# Patient Record
Sex: Male | Born: 1975 | Hispanic: Yes | Smoking: Never smoker
Health system: Southern US, Community
[De-identification: ages and names within clinical notes are randomized; demographics above are authoritative.]

---

## 2013-10-22 ENCOUNTER — Emergency Department (HOSPITAL_COMMUNITY)
Admission: EM | Admit: 2013-10-22 | Discharge: 2013-10-23 | Disposition: A | Discharge status: Home / Self Care | Payer: Self-pay | Attending: Emergency Medicine | Admitting: Emergency Medicine

## 2013-10-22 ENCOUNTER — Emergency Department (HOSPITAL_COMMUNITY): Payer: Self-pay

## 2013-10-22 ENCOUNTER — Emergency Department (HOSPITAL_COMMUNITY): Payer: Self-pay | Admitting: Anesthesiology

## 2013-10-22 ENCOUNTER — Encounter (HOSPITAL_COMMUNITY): Payer: Self-pay | Admitting: Emergency Medicine

## 2013-10-22 ENCOUNTER — Encounter (HOSPITAL_COMMUNITY): Payer: Self-pay | Admitting: Anesthesiology

## 2013-10-22 ENCOUNTER — Encounter (HOSPITAL_COMMUNITY)
Admission: EM | Disposition: A | Discharge status: Home / Self Care | Payer: Self-pay | Source: Home / Self Care | Attending: Emergency Medicine

## 2013-10-22 DIAGNOSIS — N4400 Torsion of testis, unspecified: Secondary | ICD-10-CM | POA: Insufficient documentation

## 2013-10-22 DIAGNOSIS — N501 Vascular disorders of male genital organs: Secondary | ICD-10-CM | POA: Insufficient documentation

## 2013-10-22 HISTORY — PX: SCROTAL EXPLORATION: SHX2386

## 2013-10-22 LAB — COMPREHENSIVE METABOLIC PANEL
Albumin: 4.2 g/dL (ref 3.5–5.2)
Alkaline Phosphatase: 79 U/L (ref 39–117)
BUN: 11 mg/dL (ref 6–23)
CO2: 29 mEq/L (ref 19–32)
Chloride: 99 mEq/L (ref 96–112)
Creatinine, Ser: 0.92 mg/dL (ref 0.50–1.35)
GFR calc Af Amer: >90 mL/min (ref >90)
GFR calc non Af Amer: >90 mL/min (ref >90)
Glucose, Bld: 107 mg/dL — ABNORMAL HIGH (ref 70–99)
Potassium: 3.7 mEq/L (ref 3.5–5.1)
Sodium: 136 mEq/L (ref 135–145)
Total Bilirubin: 0.3 mg/dL (ref 0.3–1.2)
Total Protein: 7.9 g/dL (ref 6.0–8.3)

## 2013-10-22 LAB — CBC WITH DIFFERENTIAL/PLATELET
Basophils Absolute: 0 10*3/uL (ref 0.0–0.1)
HCT: 40.7 % (ref 39.0–52.0)
Hemoglobin: 14.6 g/dL (ref 13.0–17.0)
Lymphocytes Relative: 21 % (ref 12–46)
Lymphs Abs: 1.8 10*3/uL (ref 0.7–4.0)
MCH: 30.9 pg (ref 26.0–34.0)
MCHC: 35.9 g/dL (ref 30.0–36.0)
Monocytes Absolute: 0.9 10*3/uL (ref 0.1–1.0)
Monocytes Relative: 10 % (ref 3–12)
Neutro Abs: 6 10*3/uL (ref 1.7–7.7)
Neutrophils Relative %: 68 % (ref 43–77)
Platelets: 259 10*3/uL (ref 150–400)
RBC: 4.72 MIL/uL (ref 4.22–5.81)
WBC: 8.8 10*3/uL (ref 4.0–10.5)

## 2013-10-22 SURGERY — EXPLORATION, SCROTUM
Anesthesia: General | Site: Scrotum | Laterality: Bilateral | Wound class: Clean Contaminated

## 2013-10-22 MED ORDER — HYDROCODONE-ACETAMINOPHEN 5-325 MG PO TABS: 1.0000 | ORAL_TABLET | ORAL | Status: DC | PRN

## 2013-10-22 MED ORDER — CEFAZOLIN SODIUM-DEXTROSE 2-3 GM-% IV SOLR
2.0000 g | Freq: Three times a day (TID) | INTRAVENOUS | Status: DC
  Administered 2013-10-22 (×2): 2 g via INTRAVENOUS
  Filled 2013-10-22: qty 50

## 2013-10-22 MED ORDER — MIDAZOLAM HCL 5 MG/5ML IJ SOLN
INTRAMUSCULAR | Status: DC | PRN
  Administered 2013-10-22: 2 mg via INTRAVENOUS

## 2013-10-22 MED ORDER — GLYCOPYRROLATE 0.2 MG/ML IJ SOLN
INTRAMUSCULAR | Status: DC | PRN
  Administered 2013-10-22: 0.4 mg via INTRAVENOUS

## 2013-10-22 MED ORDER — ONDANSETRON HCL 4 MG/2ML IJ SOLN
INTRAMUSCULAR | Status: DC | PRN
  Administered 2013-10-22: 4 mg via INTRAVENOUS

## 2013-10-22 MED ORDER — OXYCODONE HCL 5 MG PO TABS
5.0000 mg | ORAL_TABLET | Freq: Once | ORAL | Status: AC | PRN
  Administered 2013-10-23: 5 mg via ORAL

## 2013-10-22 MED ORDER — DEXAMETHASONE SODIUM PHOSPHATE 4 MG/ML IJ SOLN
INTRAMUSCULAR | Status: DC | PRN
  Administered 2013-10-22: 4 mg via INTRAVENOUS

## 2013-10-22 MED ORDER — BUPIVACAINE HCL (PF) 0.25 % IJ SOLN
INTRAMUSCULAR | Status: AC
  Filled 2013-10-22: qty 30

## 2013-10-22 MED ORDER — SUCCINYLCHOLINE CHLORIDE 20 MG/ML IJ SOLN
INTRAMUSCULAR | Status: DC | PRN
  Administered 2013-10-22: 100 mg via INTRAVENOUS

## 2013-10-22 MED ORDER — MORPHINE SULFATE 4 MG/ML IJ SOLN
4.0000 mg | Freq: Once | INTRAMUSCULAR | Status: DC
  Filled 2013-10-22: qty 1

## 2013-10-22 MED ORDER — MORPHINE SULFATE 4 MG/ML IJ SOLN
4.0000 mg | Freq: Once | INTRAMUSCULAR | Status: AC
  Administered 2013-10-22: 4 mg via INTRAMUSCULAR

## 2013-10-22 MED ORDER — OXYCODONE HCL 5 MG/5ML PO SOLN: 5.0000 mg | Freq: Once | ORAL | Status: AC | PRN

## 2013-10-22 MED ORDER — NEOSTIGMINE METHYLSULFATE 1 MG/ML IJ SOLN
INTRAMUSCULAR | Status: DC | PRN
  Administered 2013-10-22: 3.5 mg via INTRAVENOUS

## 2013-10-22 MED ORDER — HYDROMORPHONE HCL PF 1 MG/ML IJ SOLN: 0.2500 mg | INTRAMUSCULAR | Status: DC | PRN

## 2013-10-22 MED ORDER — PROPOFOL 10 MG/ML IV BOLUS
INTRAVENOUS | Status: DC | PRN
  Administered 2013-10-22: 200 mg via INTRAVENOUS

## 2013-10-22 MED ORDER — FENTANYL CITRATE 0.05 MG/ML IJ SOLN
INTRAMUSCULAR | Status: DC | PRN
  Administered 2013-10-22: 100 ug via INTRAVENOUS
  Administered 2013-10-22: 150 ug via INTRAVENOUS

## 2013-10-22 MED ORDER — LIDOCAINE HCL (CARDIAC) 20 MG/ML IV SOLN
INTRAVENOUS | Status: DC | PRN
  Administered 2013-10-22: 100 mg via INTRAVENOUS

## 2013-10-22 MED ORDER — LACTATED RINGERS IV SOLN
INTRAVENOUS | Status: DC | PRN
  Administered 2013-10-22: 22:00:00 via INTRAVENOUS

## 2013-10-22 MED ORDER — METOCLOPRAMIDE HCL 5 MG/ML IJ SOLN: 10.0000 mg | Freq: Once | INTRAMUSCULAR | Status: AC | PRN

## 2013-10-22 MED ORDER — BUPIVACAINE HCL (PF) 0.25 % IJ SOLN
INTRAMUSCULAR | Status: DC | PRN
  Administered 2013-10-22: 10 mL

## 2013-10-22 SURGICAL SUPPLY — 33 items
BAG URO CATCHER STRL LF (DRAPE) IMPLANT
BANDAGE GAUZE ELAST BULKY 4 IN (GAUZE/BANDAGES/DRESSINGS) ×2 IMPLANT
BLADE SURG 15 STRL LF DISP TIS (BLADE) IMPLANT
BLADE SURG 15 STRL SS (BLADE)
CANISTER SUCTION 2500CC (MISCELLANEOUS) ×2 IMPLANT
CLOTH BEACON ORANGE TIMEOUT ST (SAFETY) ×2 IMPLANT
CONT SPEC 4OZ CLIKSEAL STRL BL (MISCELLANEOUS) ×4 IMPLANT
DRAPE LAPAROTOMY T 102X78X121 (DRAPES) ×2 IMPLANT
DRSG PAD ABDOMINAL 8X10 ST (GAUZE/BANDAGES/DRESSINGS) ×4 IMPLANT
ELECT REM PT RETURN 9FT ADLT (ELECTROSURGICAL) ×2
ELECTRODE REM PT RTRN 9FT ADLT (ELECTROSURGICAL) ×1 IMPLANT
GLOVE BIOGEL PI IND STRL 6.5 (GLOVE) ×1 IMPLANT
GLOVE BIOGEL PI INDICATOR 6.5 (GLOVE) ×1
GLOVE SKINSENSE STRL SZ6.0 (GLOVE) ×2 IMPLANT
GLOVE SURG ORTHO 8.0 STRL STRW (GLOVE) ×2 IMPLANT
KIT BASIN OR (CUSTOM PROCEDURE TRAY) ×2 IMPLANT
KIT ROOM TURNOVER OR (KITS) ×2 IMPLANT
NS IRRIG 1000ML POUR BTL (IV SOLUTION) ×2 IMPLANT
PACK GENERAL/GYN (CUSTOM PROCEDURE TRAY) ×2 IMPLANT
PAD ARMBOARD 7.5X6 YLW CONV (MISCELLANEOUS) ×4 IMPLANT
SOLUTION BETADINE 4OZ (MISCELLANEOUS) ×2 IMPLANT
SPONGE GAUZE 4X4 12PLY (GAUZE/BANDAGES/DRESSINGS) ×2 IMPLANT
SPONGE LAP 18X18 X RAY DECT (DISPOSABLE) IMPLANT
SUT CHROMIC 3 0 SH 27 (SUTURE) ×2 IMPLANT
SUT PROLENE 4 0 PS 2 18 (SUTURE) ×2 IMPLANT
SUT SILK 2 0 TIES 10X30 (SUTURE) ×2 IMPLANT
SUT SILK 3 0 (SUTURE) ×1
SUT SILK 3-0 18XBRD TIE 12 (SUTURE) ×1 IMPLANT
SUT VICRYL 4-0 PS2 18IN ABS (SUTURE) ×2 IMPLANT
SWAB COLLECTION DEVICE MRSA (MISCELLANEOUS) IMPLANT
TOWEL OR 17X24 6PK STRL BLUE (TOWEL DISPOSABLE) ×2 IMPLANT
TOWEL OR 17X26 10 PK STRL BLUE (TOWEL DISPOSABLE) ×2 IMPLANT
WATER STERILE IRR 1000ML POUR (IV SOLUTION) ×2 IMPLANT

## 2013-10-22 NOTE — Anesthesia Procedure Notes (Addendum)
Procedure Name: Intubation Date/Time: 10/22/2013 10:26 PM Performed by: Jennilee Demarco S Pre-anesthesia Checklist: Patient identified, Timeout performed, Emergency Drugs available, Suction available and Patient being monitored Patient Re-evaluated:Patient Re-evaluated prior to inductionOxygen Delivery Method: Circle system utilized Preoxygenation: Pre-oxygenation with 100% oxygen Intubation Type: IV induction, Rapid sequence and Cricoid Pressure applied Ventilation: Mask ventilation without difficulty Laryngoscope Size: Mac and 3 Grade View: Grade I Tube type: Oral Tube size: 7.5 mm Number of attempts: 1 Airway Equipment and Method: Stylet Placement Confirmation: ETT inserted through vocal cords under direct vision,  positive ETCO2 and breath sounds checked- equal and bilateral Secured at: 21 cm Tube secured with: Tape Dental Injury: Teeth and Oropharynx as per pre-operative assessment

## 2013-10-22 NOTE — ED Provider Notes (Signed)
Medical screening examination/treatment/procedure(s) were conducted as a shared visit with non-physician practitioner(s) and myself.  I personally evaluated the patient during the encounter.  36 hour history of L testicle pain. Denies trauma.   L testicle is high riding and tender and edematous.   Ongoing pain for 36 hours, testicle likely infarcted. Manual detorsion not attempted. D/w Dr. Retta Diones.   EKG Interpretation     Ventricular Rate:    PR Interval:    QRS Duration:   QT Interval:    QTC Calculation:   R Axis:     Text Interpretation:                 Glynn Octave, MD 10/22/13 2342

## 2013-10-22 NOTE — Op Note (Signed)
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 Zareth Rippetoe, M.D.   Anesthesia: Gen.   Complications:None  Specimen(s):Left testicle  Drain(s):None  Indications:37 year old male who presented about 7 hours ago to the emergency room with a daylong history of left scrotal pain. The patient was found on ultrasound To have no flow to the left testicle with a probable torsion. Exam confirmed this. The patient presents at this time for left scrotal expiration, possible orchiectomy and right orchidopexy. The risks and complications of the procedure were discussed through an interpreter with the patient. He understands these and desires to proceed.   Technique and findings:The patient was properly identified and marked in the holding area. He received 2 g of IV Ancef. He was taken to the operating room where general anesthetic was administered with the LMA. He was placed in the recumbent position. Genitalia perineum and lower abdomen were prepped and draped. Proper timeout was then performed.  I then made a 2-1/2 cm incision in his anterior scrotum in the median raphae. This was carried down to the left tunica albuginea with electrocautery and sharp dissection. There was a hematocele present. The testicle was delivered through the wound. It was obviously infarcted. I did not think at this point that I needed to observe the testicle following detorsion. Because of the loop day long history of pain and infarction, I then felt it would be necessary to perform an orchiectomy. I dissected up the cord, and clamped the cord into different package. Distal to the clamps, the cord was divided. I then ligated each section of cord with 2 separate 2-0 silk sutures. I then blocked the cord with 10 cc of quarter percent plain Marcaine. There was adequate  hemostasis following observation of the ligated cord. There was no bleeding in the left hemiscrotum. I then turned my attention to the right hemiscrotum. The dartos fascia was divided, down to the tunica vaginalis which was then opened. I delivered the testicle the tunica vaginalis. It was inspected and found to be normal. I then sutured the tunica albuginea to the inner part of the dartos fascia in 3 separate areas, one inferiorly, one to the right and one to the left. 4-0 Prolene was used for this. Adequate hemostasis was present at this point. I then closed the dartos fascia with a running 3-0 chromic. I then closed the skin with a running 4-0 Vicryl placed in a simple running fashion. I then placed a fluff dressing and mesh underpants. The patient was then awakened and taken to the PACU in stable condition. Tolerated the procedure well.  He will followup in 2-3 weeks in my office. He was sent home with a prescription for Norco. He was given instructions.

## 2013-10-22 NOTE — Anesthesia Preprocedure Evaluation (Signed)
Anesthesia Evaluation  Patient identified by MRN, date of birth, ID band Patient awake    Reviewed: Allergy & Precautions, H&P , NPO status , Patient's Chart, lab work & pertinent test results, reviewed documented beta blocker date and time   Airway Mallampati: II TM Distance: >3 FB Neck ROM: full    Dental   Pulmonary neg pulmonary ROS,  breath sounds clear to auscultation        Cardiovascular negative cardio ROS  Rhythm:regular     Neuro/Psych negative neurological ROS  negative psych ROS   GI/Hepatic negative GI ROS, Neg liver ROS,   Endo/Other  negative endocrine ROS  Renal/GU negative Renal ROS  negative genitourinary   Musculoskeletal   Abdominal   Peds  Hematology negative hematology ROS (+)   Anesthesia Other Findings See surgeon's H&P   Reproductive/Obstetrics negative OB ROS                           Anesthesia Physical Anesthesia Plan  ASA: II  Anesthesia Plan: General   Post-op Pain Management:    Induction: Intravenous, Rapid sequence and Cricoid pressure planned  Airway Management Planned: Oral ETT  Additional Equipment:   Intra-op Plan:   Post-operative Plan: Extubation in OR  Informed Consent: I have reviewed the patients History and Physical, chart, labs and discussed the procedure including the risks, benefits and alternatives for the proposed anesthesia with the patient or authorized representative who has indicated his/her understanding and acceptance.   Dental Advisory Given  Plan Discussed with: CRNA and Surgeon  Anesthesia Plan Comments:         Anesthesia Quick Evaluation  

## 2013-10-22 NOTE — ED Notes (Signed)
Surgeon, MD at bedside.

## 2013-10-22 NOTE — Transfer of Care (Signed)
Immediate Anesthesia Transfer of Care Note  Patient: Spencer Peterson  Procedure(s) Performed: Procedure(s): SCROTUM EXPLORATION, Left Orchiectomy, Right Orchiopexy (Bilateral)  Patient Location: PACU  Anesthesia Type:General  Level of Consciousness: awake, alert  and oriented  Airway & Oxygen Therapy: Patient Spontanous Breathing and Patient connected to nasal cannula oxygen  Post-op Assessment: Report given to PACU RN and Post -op Vital signs reviewed and stable  Post vital signs: Reviewed and stable  Complications: No apparent anesthesia complications

## 2013-10-22 NOTE — ED Provider Notes (Signed)
CSN: [id number]     Arrival date & time 10/22/13  1808 History   First MD Initiated Contact with Patient 10/22/13 1857     Chief Complaint  Patient presents with  . painful genitalia    (Consider location/radiation/quality/duration/timing/severity/associated sxs/prior Treatment) HPI Comments: Patient presents today with a chief complaint of pain and swelling of his left scrotum.  Pain and swelling has been present since yesterday and is gradually worsening.  He reports that he has never had anything like this before.  He denies penile pain, penile discharge, or swelling of the penis.  He reports that he is sexually active with his wife.  He denies abdominal pain.  Denies nausea or vomiting.  Denies urinary symptoms.    The history is provided by the patient. The history is limited by a language barrier. A language interpreter was used (phone interpretor used.).    History reviewed. No pertinent past medical history. History reviewed. No pertinent past surgical history. No family history on file. History  Substance Use Topics  . Smoking status: Never Smoker   . Smokeless tobacco: Not on file  . Alcohol Use: No    Review of Systems  Genitourinary: Positive for scrotal swelling and testicular pain.  All other systems reviewed and are negative.    Allergies  Review of patient's allergies indicates no known allergies.  Home Medications  No current outpatient prescriptions on file. BP 135/89  Pulse 78  Temp(Src) 98.7 F (37.1 C)  Resp 20  Wt 158 lb (71.668 kg)  SpO2 98% Physical Exam  Nursing note and vitals reviewed. Constitutional: He appears well-developed and well-nourished.  HENT:  Head: Normocephalic and atraumatic.  Mouth/Throat: Oropharynx is clear and moist.  Neck: Normal range of motion. Neck supple.  Cardiovascular: Normal rate, regular rhythm and normal heart sounds.   Pulmonary/Chest: Effort normal and breath sounds normal.  Abdominal: Soft. Bowel sounds are  normal. He exhibits no distension and no mass. There is no tenderness. There is no rebound and no guarding.  Genitourinary: Right testis shows no swelling and no tenderness. Left testis shows swelling and tenderness. No penile erythema or penile tenderness. No discharge found.  Chaperone present High riding left testicle Swelling and tenderness of the left scrotum   Neurological: He is alert.  Skin: Skin is warm and dry.  Psychiatric: He has a normal mood and affect.    ED Course  Procedures (including critical care time) Labs Review Labs Reviewed  COMPREHENSIVE METABOLIC PANEL - Abnormal; Notable for the following:    Glucose, Bld 107 (*)    All other components within normal limits  GC/CHLAMYDIA PROBE AMP  CBC WITH DIFFERENTIAL  URINALYSIS, ROUTINE W REFLEX MICROSCOPIC   Imaging Review US Scrotum  10/22/2013   CLINICAL DATA:  35-year- male with testicular pain. Initial encounter.  EXAM: SCROTAL ULTRASOUND  DOPPLER ULTRASOUND OF THE TESTICLES  TECHNIQUE: Complete ultrasound examination of the testicles, epididymis, and other scrotal structures was performed. Color and spectral Doppler ultrasound were also utilized to evaluate blood flow to the testicles.  COMPARISON:  None.  FINDINGS: Right testicle  Measurements: Normal echotexture. 4.8 x 2.6 x 3.2 cm. No mass or microlithiasis visualized.  Left testicle  Measurements: Abnormal heterogeneous echotexture. Asymmetrically enlarged, 4.0 x 3.5 x 3.7 cm. No discrete mass identified.  Right epididymis:  Normal in size and appearance.  Left epididymis:  Appears asymmetrically enlarged and hypoechogenic.  Hydrocele:  None visualized.  Varicocele:  None visualized.  Pulsed Doppler interrogation of both testes  demonstrates absent flow in the left testicle and epididymis.  There is preserved low-density waveforms and power Doppler in the right testicle and epididymis.  IMPRESSION: 1. Positive for left testicular torsion. Edematous left testicle and  epididymis.  Critical Value/emergent results were called by telephone at the time of interpretation on 10/22/2013 at 8:45 PM to PA [facility] , who verbally acknowledged these results.  2.  Normal right testicle and epididymis.   Electronically Signed   By: Augusto Gamble M.D.   On: 10/22/2013 20:45   [place] Art/ven Flow Abd Pelv Doppler  10/22/2013   CLINICAL DATA:  35-year- male with testicular pain. Initial encounter.  EXAM: SCROTAL ULTRASOUND  DOPPLER ULTRASOUND OF THE TESTICLES  TECHNIQUE: Complete ultrasound examination of the testicles, epididymis, and other scrotal structures was performed. Color and spectral Doppler ultrasound were also utilized to evaluate blood flow to the testicles.  COMPARISON:  None.  FINDINGS: Right testicle  Measurements: Normal echotexture. 4.8 x 2.6 x 3.2 cm. No mass or microlithiasis visualized.  Left testicle  Measurements: Abnormal heterogeneous echotexture. Asymmetrically enlarged, 4.0 x 3.5 x 3.7 cm. No discrete mass identified.  Right epididymis:  Normal in size and appearance.  Left epididymis:  Appears asymmetrically enlarged and hypoechogenic.  Hydrocele:  None visualized.  Varicocele:  None visualized.  Pulsed Doppler interrogation of both testes demonstrates absent flow in the left testicle and epididymis.  There is preserved low-density waveforms and power Doppler in the right testicle and epididymis.  IMPRESSION: 1. Positive for left testicular torsion. Edematous left testicle and epididymis.  Critical Value/emergent results were called by telephone at the time of interpretation on 10/22/2013 at 8:45 PM to PA [facility] , who verbally acknowledged these results.  2.  Normal right testicle and epididymis.   Electronically Signed   By: Augusto Gamble M.D.   On: 10/22/2013 20:45    EKG Interpretation   None      8:58 PM Discussed with Dr. Retta Diones with Urology.  He reports that he will come see the patient in the ED.  MDM  No diagnosis found. Patient presents  today with a chief complaint of left testicular pain and swelling that has been present since yesterday.  Scrotal ultrasound showing left testicular torsion.  Urology consulted.  Urology has agreed to see the patient in the ED and admit for surgical management.      Santiago Glad, PA-C 10/22/13 2329

## 2013-10-22 NOTE — ED Notes (Signed)
The pt has had pain and swelling in his penis since yesterday

## 2013-10-22 NOTE — Anesthesia Postprocedure Evaluation (Signed)
Anesthesia Post Note  Patient: Spencer Peterson  Procedure(s) Performed: Procedure(s) (LRB): SCROTUM EXPLORATION, Left Orchiectomy, Right Orchiopexy (Bilateral)  Anesthesia type: General  Patient location: PACU  Post pain: Pain level controlled  Post assessment: Patient's Cardiovascular Status Stable  Last Vitals:  Filed Vitals:   10/22/13 2315  BP: 132/96  Pulse: 66  Temp:   Resp: 17    Post vital signs: Reviewed and stable  Level of consciousness: alert  Complications: No apparent anesthesia complications

## 2013-10-22 NOTE — ED Provider Notes (Signed)
Spencer Peterson S 8:30 PM patient discussed in sign out. Pt waiting to be seen by urology specialist for testicular torsion.   Dr. Retta Diones has seen the patient and he will plan to take to the OR for surgery.  Angus Seller, PA-C 10/22/13 2207

## 2013-10-22 NOTE — H&P (Signed)
Urology History and Physical Exam  CC: Testicular torsion  HPI: 37 year old Hispanic male presented to the emergency room here at [facility] several hours ago with a one-day history of left testicular pain. This was fairly severe. The patient was sent to ultrasound with the provisional diagnosis of testicular torsion. This was verified by scrotal ultrasound and Doppler. There was no flow on the Doppler study. Despite the fact that the patient has had this pain for over 24 hours, he presents at this time to go to the operating room for an attempted detorsion and probable orchiectomy.  The patient is a Education administrator. He is married and has a 68-month-old son.  PMH: History reviewed. No pertinent past medical history.  PSH: History reviewed. No pertinent past surgical history.  Allergies: No Known Allergies  Medications:  (Not in a hospital admission)   Social History: History   Social History  . Marital Status: Married    Spouse Name: N/A    Number of Children: N/A  . Years of Education: N/A   Occupational History  . Not on file.   Social History Main Topics  . Smoking status: Never Smoker   . Smokeless tobacco: Not on file  . Alcohol Use: No  . Drug Use: Not on file  . Sexual Activity: Not on file   Other Topics Concern  . Not on file   Social History Narrative  . No narrative on file    Family History: No family history on file.  Review of Systems: Positive: Left testicular pain, scrotal swelling  Negative: .  A further 10 point review of systems was negative except what is listed in the HPI.  Physical Exam: @VITALS2 @ General: No acute distress.  Awake. Head:  Normocephalic.  Atraumatic. ENT:  EOMI.  Mucous membranes moist Neck:  Supple.  No lymphadenopathy. CV:  S1 present. S2 present. Regular rate. Pulmonary: Equal effort bilaterally.  Clear to auscultation bilaterally. Abdomen: Soft.  Non tender to palpation. Skin:  Normal turgor.  No visible  rash. Extremity: No gross deformity of bilateral upper extremities.  No gross deformity of    bilateral lower extremities. Neurologic: Alert. Appropriate mood.  Penis:  Uncircumcised.  No lesions. Scrotum: No lesions.  No ecchymosis.  No erythema. Testicles: Testicles are descended bilaterally. The left testicle is high lying in the left hemiscrotum. There is tenderness. There is a positive cremaster reflex on the right, none on the left.   Studies:  Recent Labs     10/22/13  1823  HGB  14.6  WBC  8.8  PLT  259    Recent Labs     10/22/13  1823  NA  136  K  3.7  CL  99  CO2  29  BUN  11  CREATININE  0.92  CALCIUM  9.5  GFRNONAA  >90  GFRAA  >90     No results found for this basename: PT, INR, APTT,  in the last 72 hours   No components found with this basename: ABG,     Assessment:  Left testicular torsion with probable infarct  Plan: Scrotal expiration, detorsion of left testicle, probable left orchiectomy, right orchidopexy.  I've discussed the procedure with the patient, risks and complications. We also discussed briefly long-term implications-were likely, this will not affect his fertility. He understands the procedure and desires to proceed.Marland Kitchen

## 2013-10-22 NOTE — Preoperative (Signed)
Beta Blockers   Reason not to administer Beta Blockers:Not Applicable 

## 2013-10-23 LAB — GC/CHLAMYDIA PROBE AMP
CT Probe RNA: NEGATIVE
GC Probe RNA: NEGATIVE

## 2013-10-23 MED ORDER — OXYCODONE HCL 5 MG PO TABS
ORAL_TABLET | ORAL | Status: AC
  Filled 2013-10-23: qty 1

## 2013-10-23 NOTE — Progress Notes (Signed)
Pt and family speak very little english.  Used interpreter phones to discuss with sister important discharge instructions, and went over procedure with her.  Sister then discussed the information with her brother (the patient) and his wife.  All seem to understand pretty well.  Some of the written discharge information was presented in spanish.

## 2013-10-24 ENCOUNTER — Encounter (HOSPITAL_COMMUNITY): Payer: Self-pay | Admitting: Urology

## 2013-10-28 NOTE — ED Provider Notes (Signed)
Medical screening examination/treatment/procedure(s) were performed by non-physician practitioner and as supervising physician I was immediately available for consultation/collaboration.  EKG Interpretation     Ventricular Rate:    PR Interval:    QRS Duration:   QT Interval:    QTC Calculation:   R Axis:     Text Interpretation:                Roney Marion, MD 10/28/13 [phone]

## 2019-03-17 ENCOUNTER — Encounter (HOSPITAL_COMMUNITY): Payer: Self-pay | Admitting: Emergency Medicine

## 2019-03-17 ENCOUNTER — Emergency Department (HOSPITAL_COMMUNITY)
Admission: EM | Admit: 2019-03-17 | Discharge: 2019-03-17 | Disposition: A | Discharge status: Home / Self Care | Payer: Self-pay | Attending: Emergency Medicine | Admitting: Emergency Medicine

## 2019-03-17 DIAGNOSIS — Z79899 Other long term (current) drug therapy: Secondary | ICD-10-CM | POA: Insufficient documentation

## 2019-03-17 DIAGNOSIS — R1013 Epigastric pain: Secondary | ICD-10-CM | POA: Insufficient documentation

## 2019-03-17 DIAGNOSIS — K625 Hemorrhage of anus and rectum: Secondary | ICD-10-CM | POA: Insufficient documentation

## 2019-03-17 DIAGNOSIS — K921 Melena: Secondary | ICD-10-CM

## 2019-03-17 LAB — CBC WITH DIFFERENTIAL/PLATELET
ABS IMMATURE GRANULOCYTES: 0.06 10*3/uL (ref 0.00–0.07)
Basophils Absolute: 0.1 10*3/uL (ref 0.0–0.1)
Basophils Relative: 0 %
Eosinophils Absolute: 0.4 10*3/uL (ref 0.0–0.5)
Eosinophils Relative: 2 %
HEMATOCRIT: 39.2 % (ref 39.0–52.0)
Hemoglobin: 13.3 g/dL (ref 13.0–17.0)
Immature Granulocytes: 0 %
Lymphocytes Relative: 9 %
Lymphs Abs: 1.4 10*3/uL (ref 0.7–4.0)
MCH: 30.6 pg (ref 26.0–34.0)
MCHC: 33.9 g/dL (ref 30.0–36.0)
MCV: 90.3 fL (ref 80.0–100.0)
MONO ABS: 1.7 10*3/uL — AB (ref 0.1–1.0)
MONOS PCT: 11 %
NEUTROS PCT: 78 %
Neutro Abs: 11.2 10*3/uL — ABNORMAL HIGH (ref 1.7–7.7)
Platelets: 633 10*3/uL — ABNORMAL HIGH (ref 150–400)
RBC: 4.34 MIL/uL (ref 4.22–5.81)
RDW: 12.8 % (ref 11.5–15.5)
WBC: 14.7 10*3/uL — ABNORMAL HIGH (ref 4.0–10.5)
nRBC: 0 % (ref 0.0–0.2)

## 2019-03-17 LAB — COMPREHENSIVE METABOLIC PANEL
ALK PHOS: 80 U/L (ref 38–126)
ALT: 18 U/L (ref 0–44)
AST: 21 U/L (ref 15–41)
Albumin: 2.7 g/dL — ABNORMAL LOW (ref 3.5–5.0)
Anion gap: 7 (ref 5–15)
BILIRUBIN TOTAL: 0.4 mg/dL (ref 0.3–1.2)
BUN: 6 mg/dL (ref 6–20)
CALCIUM: 8.5 mg/dL — AB (ref 8.9–10.3)
CHLORIDE: 103 mmol/L (ref 98–111)
CO2: 25 mmol/L (ref 22–32)
CREATININE: 0.95 mg/dL (ref 0.61–1.24)
GFR calc Af Amer: >60 mL/min (ref >60)
Glucose, Bld: 102 mg/dL — ABNORMAL HIGH (ref 70–99)
Potassium: 3.6 mmol/L (ref 3.5–5.1)
Sodium: 135 mmol/L (ref 135–145)
TOTAL PROTEIN: 6.8 g/dL (ref 6.5–8.1)

## 2019-03-17 LAB — POC OCCULT BLOOD, ED: Fecal Occult Bld: POSITIVE — AB

## 2019-03-17 LAB — LIPASE, BLOOD: LIPASE: 42 U/L (ref 11–51)

## 2019-03-17 MED ORDER — SODIUM CHLORIDE 0.9 % IV BOLUS
1000.0000 mL | Freq: Once | INTRAVENOUS | Status: AC
  Administered 2019-03-17: 1000 mL via INTRAVENOUS

## 2019-03-17 NOTE — ED Provider Notes (Signed)
[facility] Provider Note   CSN: [id number] Arrival date & time: 03/17/19  1318    History   Chief Complaint No chief complaint on file.   HPI [facility] is a 43 y.o. male.     43 yo M with a cc of abdominal pain, rectal bleeding.  Going on for the past two weeks.  Usually occurs after a bowel movement.  Has some mild persistent. Pain.  Bright red blood on the paper and in the bowel. No pain or itching at the rectum.   The history is provided by the patient.  Illness  Severity:  Mild Onset quality:  Sudden Timing:  Constant Progression:  Worsening Chronicity:  New Associated symptoms: abdominal pain   Associated symptoms: no chest pain, no congestion, no diarrhea, no fever, no headaches, no myalgias, no rash, no shortness of breath and no vomiting     History reviewed. No pertinent past medical history.  There are no active problems to display for this patient.   Past Surgical History:  Procedure Laterality Date  . SCROTAL EXPLORATION Bilateral 10/22/2013   Procedure: SCROTUM EXPLORATION, Left Orchiectomy, Right Orchiopexy;  Surgeon: Marcine Matar, MD;  Location: [facility] OR;  Service: Urology;  Laterality: Bilateral;        Home Medications    Prior to Admission medications   Medication Sig Start Date End Date Taking? Authorizing Provider  acetaminophen (TYLENOL) 500 MG tablet Take 1,000 mg by mouth 2 (two) times daily as needed for moderate pain.    [provider]  HYDROcodone-acetaminophen (NORCO) 5-325 MG per tablet Take 1-2 tablets by mouth every 4 (four) hours as needed for moderate pain. 10/22/13   Marcine Matar, MD    Family History History reviewed. No pertinent family history.  Social History Social History   Tobacco Use  . Smoking status: Never Smoker  . Smokeless tobacco: Never Used  Substance Use Topics  . Alcohol use: Yes    Comment: occ  . Drug use: Never     Allergies   Patient has  no known allergies.   Review of Systems Review of Systems  Constitutional: Negative for chills and fever.  HENT: Negative for congestion and facial swelling.   Eyes: Negative for discharge and visual disturbance.  Respiratory: Negative for shortness of breath.   Cardiovascular: Negative for chest pain and palpitations.  Gastrointestinal: Positive for abdominal pain and blood in stool. Negative for diarrhea and vomiting.  Musculoskeletal: Negative for arthralgias and myalgias.  Skin: Negative for color change and rash.  Neurological: Negative for tremors, syncope and headaches.  Psychiatric/Behavioral: Negative for confusion and dysphoric mood.     Physical Exam Updated Vital Signs BP 117/78 (BP Location: Left Arm)   Pulse 93   Temp 97.9 F (36.6 C) (Oral)   Resp 18   SpO2 100%   Physical Exam Vitals signs and nursing note reviewed.  Constitutional:      Appearance: He is well-developed.  HENT:     Head: Normocephalic and atraumatic.  Eyes:     Pupils: Pupils are equal, round, and reactive to light.  Neck:     Musculoskeletal: Normal range of motion and neck supple.     Vascular: No JVD.  Cardiovascular:     Rate and Rhythm: Regular rhythm. Tachycardia present.     Heart sounds: No murmur. No friction rub. No gallop.   Pulmonary:     Effort: No respiratory distress.     Breath sounds: No wheezing.  Abdominal:     General: There is no distension.     Tenderness: There is abdominal tenderness (very mild to the epigastrium). There is no guarding or rebound.  Genitourinary:    Comments: Small hemorrhoid at the 6 o clock position, no active bleeding.  Musculoskeletal: Normal range of motion.  Skin:    Coloration: Skin is not pale.     Findings: No rash.  Neurological:     Mental Status: He is alert and oriented to person, place, and time.  Psychiatric:        Behavior: Behavior normal.      ED Treatments / Results  Labs (all labs ordered are listed, but only  abnormal results are displayed) Labs Reviewed  CBC WITH DIFFERENTIAL/PLATELET - Abnormal; Notable for the following components:      Result Value   WBC 14.7 (*)    Platelets 633 (*)    Neutro Abs 11.2 (*)    Monocytes Absolute 1.7 (*)    All other components within normal limits  COMPREHENSIVE METABOLIC PANEL - Abnormal; Notable for the following components:   Glucose, Bld 102 (*)    Calcium 8.5 (*)    Albumin 2.7 (*)    All other components within normal limits  POC OCCULT BLOOD, ED - Abnormal; Notable for the following components:   Fecal Occult Bld POSITIVE (*)    All other components within normal limits  LIPASE, BLOOD    EKG None  Radiology No results found.  Procedures Procedures (including critical care time)  Medications Ordered in ED Medications  sodium chloride 0.9 % bolus 1,000 mL (0 mLs Intravenous Stopped 03/17/19 1542)     Initial Impression / Assessment and Plan / ED Course  I have reviewed the triage vital signs and the nursing notes.  Pertinent labs & imaging results that were available during my care of the patient were reviewed by me and considered in my medical decision making (see chart for details).        43 yo M with a cc of abdominal pain and rectal bleeding.  Benign abdominal exam, very mild tenderness to the epigastrium.  Will obtain labwork.  Rectal exam with no contents in the rectum.  Tachycardic.   Patient's heart rate is improved with IV fluids.  Lab work is remarkable for a leukocytosis and his occult card was positive.  His hemoglobin is normal.  LFTs and lipase are normal.  At this point I refer him to GI for further evaluation of 2 weeks of diarrhea that he is concerned may be bloody.  PCP follow-up.  5:01 PM:  I have discussed the diagnosis/risks/treatment options with the patient and believe the pt to be eligible for discharge home to follow-up with PCP, GI. We also discussed returning to the ED immediately if new or worsening sx  occur. We discussed the sx which are most concerning (e.g., sudden worsening pain, fever, inability to tolerate by mouth) that necessitate immediate return. Medications administered to the patient during their visit and any new prescriptions provided to the patient are listed below.  Medications given during this visit Medications  sodium chloride 0.9 % bolus 1,000 mL (0 mLs Intravenous Stopped 03/17/19 1542)     The patient appears reasonably screen and/or stabilized for discharge and I doubt any other medical condition or other [facility] requiring further screening, evaluation, or treatment in the ED at this time prior to discharge.    Final Clinical Impressions(s) / ED Diagnoses   Final diagnoses:  Epigastric abdominal pain  Blood in stool    ED Discharge Orders    None       Melene Plan, DO 03/17/19 1701

## 2019-03-17 NOTE — Discharge Instructions (Signed)
Try zantac or pepcid twice a day.  Try to avoid things that may make this worse, most commonly these are spicy foods tomato based products fatty foods chocolate and peppermint.  Alcohol and tobacco can also make this worse.  Return to the emergency department for sudden worsening pain fever or inability to eat or drink.  Your labwork looked ok, no loss of blood.  See GI in the office   Prueba zantac o pepcid dos veces al da. Trate de evitar cosas que puedan empeorar esto, ms comnmente son alimentos picantes, productos a base de tomate, alimentos grasos, chocolate y [place]. El alcohol y el tabaco tambin pueden empeorar esto. Regrese a la sala de emergencias por empeoramiento repentino, fiebre del dolor o incapacidad para comer o beber.

## 2019-03-17 NOTE — ED Triage Notes (Signed)
Pt reports abdominal pain under his belly button when he has the urge to have a bowel movement x 2 weeks, last BM was today and was loose, reports it looked bloody.

## 2019-06-23 ENCOUNTER — Encounter (HOSPITAL_COMMUNITY): Payer: Self-pay

## 2019-06-23 ENCOUNTER — Emergency Department (HOSPITAL_COMMUNITY): Payer: Self-pay

## 2019-06-23 ENCOUNTER — Emergency Department (HOSPITAL_COMMUNITY)
Admission: EM | Admit: 2019-06-23 | Discharge: 2019-06-24 | Disposition: A | Discharge status: Home / Self Care | Payer: Self-pay | Attending: Emergency Medicine | Admitting: Emergency Medicine

## 2019-06-23 ENCOUNTER — Other Ambulatory Visit: Payer: Self-pay

## 2019-06-23 DIAGNOSIS — R0602 Shortness of breath: Secondary | ICD-10-CM | POA: Insufficient documentation

## 2019-06-23 DIAGNOSIS — R000 Tachycardia, unspecified: Secondary | ICD-10-CM | POA: Insufficient documentation

## 2019-06-23 MED ORDER — SODIUM CHLORIDE 0.9% FLUSH: 3.0000 mL | Freq: Once | INTRAVENOUS | Status: DC

## 2019-06-23 MED ORDER — ALBUTEROL SULFATE (2.5 MG/3ML) 0.083% IN NEBU: 5.0000 mg | INHALATION_SOLUTION | Freq: Once | RESPIRATORY_TRACT | Status: DC

## 2019-06-23 NOTE — ED Triage Notes (Signed)
Pt arrives POV for eval of shortness of breath w/ associated CP on inspiration. Pt reports onset 30 minutes PTA. States his father has been sick at home, denies cough or known covid.

## 2019-06-24 ENCOUNTER — Emergency Department (HOSPITAL_COMMUNITY): Payer: Self-pay

## 2019-06-24 ENCOUNTER — Encounter (HOSPITAL_COMMUNITY): Payer: Self-pay | Admitting: Radiology

## 2019-06-24 LAB — BASIC METABOLIC PANEL
Anion gap: 12 (ref 5–15)
BUN: <5 mg/dL — ABNORMAL LOW (ref 6–20)
CO2: 23 mmol/L (ref 22–32)
Calcium: 8.9 mg/dL (ref 8.9–10.3)
Chloride: 100 mmol/L (ref 98–111)
Creatinine, Ser: 0.69 mg/dL (ref 0.61–1.24)
GFR calc Af Amer: >60 mL/min (ref >60)
GFR calc non Af Amer: >60 mL/min (ref >60)
Glucose, Bld: 173 mg/dL — ABNORMAL HIGH (ref 70–99)
Potassium: 3 mmol/L — ABNORMAL LOW (ref 3.5–5.1)
Sodium: 135 mmol/L (ref 135–145)

## 2019-06-24 LAB — CBC
HCT: 39.2 % (ref 39.0–52.0)
Hemoglobin: 12.7 g/dL — ABNORMAL LOW (ref 13.0–17.0)
MCH: 26.2 pg (ref 26.0–34.0)
MCHC: 32.4 g/dL (ref 30.0–36.0)
MCV: 80.8 fL (ref 80.0–100.0)
Platelets: 369 10*3/uL (ref 150–400)
RBC: 4.85 MIL/uL (ref 4.22–5.81)
RDW: 16.7 % — ABNORMAL HIGH (ref 11.5–15.5)
WBC: 8.2 10*3/uL (ref 4.0–10.5)
nRBC: 0 % (ref 0.0–0.2)

## 2019-06-24 LAB — TROPONIN I (HIGH SENSITIVITY)
Troponin I (High Sensitivity): 6 ng/L (ref <18)
Troponin I (High Sensitivity): 5 ng/L (ref <18)

## 2019-06-24 MED ORDER — IOHEXOL 350 MG/ML SOLN
80.0000 mL | Freq: Once | INTRAVENOUS | Status: AC | PRN
  Administered 2019-06-24: 80 mL via INTRAVENOUS

## 2019-06-24 MED ORDER — SODIUM CHLORIDE 0.9 % IV BOLUS
1000.0000 mL | Freq: Once | INTRAVENOUS | Status: AC
  Administered 2019-06-24: 1000 mL via INTRAVENOUS

## 2019-06-24 NOTE — Discharge Instructions (Addendum)
Gracias por permitirme cuidarlo hoy en el Departamento de Emergencias.  Su tomografa computarizada no mostr un cogulo de sangre en sus pulmones. Tu anlisis de [employer]. En este momento, no estoy seguro de por qu desarrollaste falta de aliento ms temprano esta noche.  Su TC mostr algunos ndulos en su pulmn derecho. Como no fuma, es muy probable que sean benignos. Puede realizar un seguimiento con atencin primaria para determinar si debe realizarse una exploracin repetida en 12 meses.  Haga un seguimiento con atencin primaria para volver a verificar sus sntomas en la prxima semana. Si no tiene un proveedor de [employer], llame al nmero que figura en su documentacin de alta para establecer uno.  Regrese a la sala de emergencias si presenta dificultad respiratoria severa y persistente, dolor en el pecho, fiebre alta, si se desmaya, si sus labios o dedos se ponen azules, o si desarrolla otros sntomas nuevos y preocupantes.  Thank you for allowing me to care for you today in the Emergency Department.   Your CT scan did not show a blood clot in your lungs.  Your blood work was otherwise reassuring.  At this time, I am uncertain as to why you developed shortness of breath earlier tonight.  You CT did show some nodules in your right lung. Since you do not smoke, these are most likely benign. You can follow up with primary care to determine if you should have a repeat scan in 12 months.   Please follow up with primary care for a recheck of your symptoms in the next week. If you do not have a primary care provider, call the number on your discharge paperwork to get established with one.  Return to the ER if you develop severe, persistent shortness of breath, chest pain, a high fever, if you pass out, if your lips or fingers turn blue, or if you develop other new, concerning symptoms.

## 2019-06-24 NOTE — ED Provider Notes (Signed)
[facility]   CSN: [id number] Arrival date & time: 06/23/19  2225    History   Chief Complaint Chief Complaint  Patient presents with   Shortness of Breath    HPI [facility] is a 43 y.o. male with a history of rectal bleeding and alcohol use who presents to the emergency department with a chief complaint of shortness of breath.  The patient endorses shortness of breath that began suddenly approximately 30 minutes prior to arrival.  He reports that shortness of breath was constant, but seems to have significantly improved since he arrived in the ER.  He reports that he had just finished eating dinner.  He reports the shortness of breath was worse with exertion.  No known alleviating factors.  States that his hands felt cold when the shortness of breath began.  He has had no chest pain, chest tightness, palpitations, cough, fever, chills, leg swelling, abdominal pain, nausea, vomiting, or diarrhea.  No recent long travel.  No sick contacts.  No family or personal history of VTE.  No known or suspected contacts with COVID-19.  He reports that he drinks alcohol daily.  He reports that he drank 10 beers yesterday and had 1 beer approximately 1 hour prior to onset of shortness of breath tonight.  No history of DTs, complicated withdrawal, alcohol-related seizures, or tremor from withdrawal.  He denies other IV or recreational drug use.     The history is provided by the patient. No language interpreter was used.    History reviewed. No pertinent past medical history.  There are no active problems to display for this patient.   Past Surgical History:  Procedure Laterality Date   SCROTAL EXPLORATION Bilateral 10/22/2013   Procedure: SCROTUM EXPLORATION, Left Orchiectomy, Right Orchiopexy;  Surgeon: Marcine MatarStephen Dahlstedt, MD;  [facility];  Service: Urology;  Laterality: Bilateral;        Home Medications    Prior to Admission  medications   Medication Sig Start Date End Date Taking? Authorizing Provider  acetaminophen (TYLENOL) 500 MG tablet Take 1,000 mg by mouth 2 (two) times daily as needed for moderate pain.    [provider]  HYDROcodone-acetaminophen (NORCO) 5-325 MG per tablet Take 1-2 tablets by mouth every 4 (four) hours as needed for moderate pain. 10/22/13   Marcine Matarahlstedt, Stephen, MD    Family History History reviewed. No pertinent family history.  Social History Social History   Tobacco Use   Smoking status: Never Smoker   Smokeless tobacco: Never Used  Substance Use Topics   Alcohol use: Yes    Comment: occ   Drug use: Never     Allergies   Patient has no known allergies.   Review of Systems Review of Systems  Constitutional: Negative for appetite change, chills and fever.  HENT: Negative for congestion and sore throat.   Eyes: Negative for visual disturbance.  Respiratory: Positive for shortness of breath. Negative for apnea, cough, chest tightness and wheezing.   Cardiovascular: Negative for chest pain, palpitations and leg swelling.  Gastrointestinal: Negative for abdominal pain, blood in stool, diarrhea, nausea and vomiting.  Genitourinary: Negative for dysuria.  Musculoskeletal: Negative for back pain, myalgias, neck pain and neck stiffness.  Skin: Negative for rash.  Allergic/Immunologic: Negative for immunocompromised state.  Neurological: Negative for dizziness, syncope, weakness, numbness and headaches.  Psychiatric/Behavioral: Negative for confusion.   Physical Exam Updated Vital Signs BP (!) 147/96    Pulse (!) 111  Temp 98.7 F (37.1 C) (Oral)    Resp 19    Ht 5' 4.17" (1.63 m)    Wt 72.6 kg    SpO2 98%    BMI 27.32 kg/m   Physical Exam Vitals signs and nursing note reviewed.  Constitutional:      General: He is not in acute distress.    Appearance: Normal appearance. He is well-developed. He is not ill-appearing or toxic-appearing.  HENT:     Head:  Normocephalic.     Mouth/Throat:     Mouth: Mucous membranes are moist.  Eyes:     Extraocular Movements: Extraocular movements intact.     Conjunctiva/sclera: Conjunctivae normal.     Pupils: Pupils are equal, round, and reactive to light.  Neck:     Musculoskeletal: Normal range of motion and neck supple.  Cardiovascular:     Rate and Rhythm: Regular rhythm. Tachycardia present.     Heart sounds: No murmur.  Pulmonary:     Effort: Pulmonary effort is normal. No respiratory distress.     Breath sounds: No stridor. No wheezing, rhonchi or rales.     Comments: Lungs are clear to auscultation bilaterally. Abdominal:     General: There is no distension.     Palpations: Abdomen is soft. There is no mass.     Tenderness: There is no abdominal tenderness. There is no right CVA tenderness, left CVA tenderness, guarding or rebound.     Hernia: No hernia is present.  Skin:    General: Skin is warm and dry.  Neurological:     Mental Status: He is alert.  Psychiatric:        Behavior: Behavior normal.      ED Treatments / Results  Labs (all labs ordered are listed, but only abnormal results are displayed) Labs Reviewed  BASIC METABOLIC PANEL - Abnormal; Notable for the following components:      Result Value   Potassium 3.0 (*)    Glucose, Bld 173 (*)    BUN <5 (*)    All other components within normal limits  CBC - Abnormal; Notable for the following components:   Hemoglobin 12.7 (*)    RDW 16.7 (*)    All other components within normal limits  TROPONIN I (HIGH SENSITIVITY)  TROPONIN I (HIGH SENSITIVITY)    EKG None  Radiology Dg Chest 2 View  Result Date: 06/23/2019 CLINICAL DATA:  Shortness of breath with chest pain EXAM: CHEST - 2 VIEW COMPARISON:  None. FINDINGS: The heart size and mediastinal contours are within normal limits. Both lungs are clear. The visualized skeletal structures are unremarkable. IMPRESSION: No active cardiopulmonary disease. Electronically Signed    By: Alcide CleverMark  Lukens M.D.   On: 06/23/2019 23:45   Ct Angio Chest Pe W And/or Wo Contrast  Result Date: 06/24/2019 CLINICAL DATA:  Shortness of breath. EXAM: CT ANGIOGRAPHY CHEST WITH CONTRAST TECHNIQUE: Multidetector CT imaging of the chest was performed using the standard protocol during bolus administration of intravenous contrast. Multiplanar CT image reconstructions and MIPs were obtained to evaluate the vascular anatomy. CONTRAST:  80mL OMNIPAQUE IOHEXOL 350 MG/ML SOLN COMPARISON:  Chest x-ray dated 06/23/2019 FINDINGS: Cardiovascular: Satisfactory opacification of the pulmonary arteries to the segmental level. No evidence of pulmonary embolism. Normal heart size. No pericardial effusion. Mediastinum/Nodes: No enlarged mediastinal, hilar, or axillary lymph nodes. Thyroid gland, trachea, and esophagus demonstrate no significant findings. Lungs/Pleura: There is several tiny vague nodular densities in the right lower lobe and right middle lobe,  the largest being 4 mm. The lungs are otherwise clear. No effusions. Upper Abdomen: Normal. Musculoskeletal: Normal. Review of the MIP images confirms the above findings. IMPRESSION: 1. No evidence of pulmonary emboli. 2. Several tiny nodular densities in the right lung are most likely benign and inflammatory in origin. No follow-up needed if patient is low-risk (and has no known or suspected primary neoplasm). Non-contrast chest CT can be considered in 12 months if patient is high-risk. This recommendation follows the consensus statement: Guidelines for Management of Incidental Pulmonary Nodules Detected on CT Images: From the Fleischner Society 2017; Radiology 2017; 284:228-243. Electronically Signed   By: Lorriane Shire M.D.   On: 06/24/2019 04:45    Procedures Procedures (including critical care time)  Medications Ordered in ED Medications  sodium chloride flush (NS) 0.9 % injection 3 mL (3 mLs Intravenous Not Given 06/24/19 0237)  iohexol (OMNIPAQUE) 350 MG/ML  injection 80 mL (80 mLs Intravenous Contrast Given 06/24/19 0404)  sodium chloride 0.9 % bolus 1,000 mL (0 mLs Intravenous Stopped 06/24/19 0649)     Initial Impression / Assessment and Plan / ED Course  I have reviewed the triage vital signs and the nursing notes.  Pertinent labs & imaging results that were available during my care of the patient were reviewed by me and considered in my medical decision making (see chart for details).        43 year old male with a history of rectal bleeding and alcohol use presenting with shortness of breath that began tonight.  States his hands felt "cold" when symptoms because, but reports this has since resolved. No other associated symptoms.  Patient is tachycardic and mildly hypertensive, but otherwise hemodynamically stable without tachypnea or hypoxia.  EKG with sinus tachycardia.  Delta troponin ordered by triage, delta was not uptrending. He has having no bleeding.  Labs are otherwise grossly unremarkable.  Patient is not PERC negative given shortness of breath and the setting of tachycardia, will order PE study.  PE study with nodules in the right lung.  These findings were discussed with the patient, but he is low risk since he is a non-smoker.  Patient was given 1 L of fluids to see if tachycardia improved.  Recheck temperature is 97.8.  Tachycardia could be second to alcohol withdrawal as he does have a longstanding history of alcohol use.  He did have one beer tonight.  CIWA score was 1.   Following IV fluids, tachycardia not resolved.  He previously had a positive Hemoccult, but denies bleeding at this time and hemoglobin is stable from previous.  He remained asymptomatic.  Of note, it does appear that tachycardia does resolve and the patient is resting and relaxing.  Could be secondary to anxiety his heart rate does seem to increase when staff enters the room.  Shared decision making conversation with the patient for COVID-19 testing as he was  endorsing shortness of breath.  He declines testing at this time.  He has not established with a primary care provider, but referrals have been given.  Patient reports that he is feeling much better.  He is not hypoxic.  All questions answered.  He is hemodynamically stable and safe for discharge home at this time.    Final Clinical Impressions(s) / ED Diagnoses   Final diagnoses:  Shortness of breath  Tachycardia    ED Discharge Orders    None       Joanne Gavel, PA-C 06/24/19 0754    Horton, Barbette Hair, MD  06/28/19 0953 ° °

## 2019-12-26 ENCOUNTER — Emergency Department (HOSPITAL_COMMUNITY)
Admission: EM | Admit: 2019-12-26 | Discharge: 2019-12-27 | Disposition: A | Discharge status: Home / Self Care | Payer: Self-pay | Attending: Emergency Medicine | Admitting: Emergency Medicine

## 2019-12-26 ENCOUNTER — Other Ambulatory Visit: Payer: Self-pay

## 2019-12-26 ENCOUNTER — Encounter (HOSPITAL_COMMUNITY): Payer: Self-pay | Admitting: *Deleted

## 2019-12-26 DIAGNOSIS — E876 Hypokalemia: Secondary | ICD-10-CM | POA: Insufficient documentation

## 2019-12-26 DIAGNOSIS — K852 Alcohol induced acute pancreatitis without necrosis or infection: Secondary | ICD-10-CM | POA: Insufficient documentation

## 2019-12-26 LAB — URINALYSIS, ROUTINE W REFLEX MICROSCOPIC
Bilirubin Urine: NEGATIVE
Glucose, UA: NEGATIVE mg/dL
Hgb urine dipstick: NEGATIVE
Ketones, ur: 20 mg/dL — AB
Leukocytes,Ua: NEGATIVE
Nitrite: NEGATIVE
Protein, ur: 100 mg/dL — AB
Specific Gravity, Urine: 1.021 (ref 1.005–1.030)
pH: 7 (ref 5.0–8.0)

## 2019-12-26 LAB — COMPREHENSIVE METABOLIC PANEL
ALT: 34 U/L (ref 0–44)
AST: 39 U/L (ref 15–41)
Albumin: 3.9 g/dL (ref 3.5–5.0)
Alkaline Phosphatase: 88 U/L (ref 38–126)
Anion gap: 13 (ref 5–15)
BUN: 7 mg/dL (ref 6–20)
CO2: 25 mmol/L (ref 22–32)
Calcium: 8.8 mg/dL — ABNORMAL LOW (ref 8.9–10.3)
Chloride: 97 mmol/L — ABNORMAL LOW (ref 98–111)
Creatinine, Ser: 0.68 mg/dL (ref 0.61–1.24)
GFR calc Af Amer: >60 mL/min (ref >60)
GFR calc non Af Amer: >60 mL/min (ref >60)
Glucose, Bld: 126 mg/dL — ABNORMAL HIGH (ref 70–99)
Potassium: 3.1 mmol/L — ABNORMAL LOW (ref 3.5–5.1)
Sodium: 135 mmol/L (ref 135–145)
Total Bilirubin: 0.5 mg/dL (ref 0.3–1.2)
Total Protein: 7.2 g/dL (ref 6.5–8.1)

## 2019-12-26 LAB — CBC
HCT: 41.7 % (ref 39.0–52.0)
Hemoglobin: 14 g/dL (ref 13.0–17.0)
MCH: 29.7 pg (ref 26.0–34.0)
MCHC: 33.6 g/dL (ref 30.0–36.0)
MCV: 88.5 fL (ref 80.0–100.0)
Platelets: 302 10*3/uL (ref 150–400)
RBC: 4.71 MIL/uL (ref 4.22–5.81)
RDW: 14.1 % (ref 11.5–15.5)
WBC: 10.5 10*3/uL (ref 4.0–10.5)
nRBC: 0 % (ref 0.0–0.2)

## 2019-12-26 MED ORDER — SODIUM CHLORIDE 0.9% FLUSH
3.0000 mL | Freq: Once | INTRAVENOUS | Status: AC
  Administered 2019-12-27: 02:00:00 3 mL via INTRAVENOUS

## 2019-12-26 NOTE — ED Triage Notes (Signed)
Via spanish interpreter, Pt reports midline abdominal pain that started last night but got worse today. Worse after eating. No n/v/d.

## 2019-12-27 LAB — LIPASE, BLOOD: Lipase: 1099 U/L — ABNORMAL HIGH (ref 11–51)

## 2019-12-27 MED ORDER — ONDANSETRON 4 MG PO TBDP: ORAL_TABLET | ORAL | 0 refills | Status: DC

## 2019-12-27 MED ORDER — POTASSIUM CHLORIDE CRYS ER 20 MEQ PO TBCR
40.0000 meq | EXTENDED_RELEASE_TABLET | Freq: Once | ORAL | Status: AC
  Administered 2019-12-27: 03:00:00 40 meq via ORAL
  Filled 2019-12-27: qty 2

## 2019-12-27 MED ORDER — HYDROMORPHONE HCL 1 MG/ML IJ SOLN
1.0000 mg | Freq: Once | INTRAMUSCULAR | Status: AC
  Administered 2019-12-27: 1 mg via INTRAVENOUS
  Filled 2019-12-27: qty 1

## 2019-12-27 MED ORDER — HYDROCODONE-ACETAMINOPHEN 5-325 MG PO TABS: 2.0000 | ORAL_TABLET | ORAL | 0 refills | Status: DC | PRN

## 2019-12-27 MED ORDER — POTASSIUM CHLORIDE 10 MEQ/100ML IV SOLN
10.0000 meq | Freq: Once | INTRAVENOUS | Status: AC
  Administered 2019-12-27: 03:00:00 10 meq via INTRAVENOUS
  Filled 2019-12-27: qty 100

## 2019-12-27 MED ORDER — ONDANSETRON HCL 4 MG/2ML IJ SOLN
4.0000 mg | Freq: Once | INTRAMUSCULAR | Status: AC
  Administered 2019-12-27: 01:00:00 4 mg via INTRAVENOUS
  Filled 2019-12-27: qty 2

## 2019-12-27 MED ORDER — HYDROMORPHONE HCL 1 MG/ML IJ SOLN
1.0000 mg | Freq: Once | INTRAMUSCULAR | Status: AC
  Administered 2019-12-27: 03:00:00 1 mg via INTRAVENOUS
  Filled 2019-12-27: qty 1

## 2019-12-27 NOTE — ED Provider Notes (Signed)
[facility] EMERGENCY DEPARTMENT Provider Note   CSN: [id number] Arrival date & time: 12/26/19  2233     History Chief Complaint  Patient presents with  . Abdominal Pain    Spencer Peterson is a 44 y.o. male with a hx of no medical problems presents to the Emergency Department complaining of gradual, persistent, progressively worsening epigastric abdominal pain onset 12/25/19.  He reports the night before he drank 11 beers and awoke with some generalized abdominal pain.  He reports he was able to eat that day but the pain worsened.  He reports this morning he awoke with severe pain that has been persistent and worsening throughout the day today.  He reports associated nausea without vomiting or diarrhea.  Patient reports he normally drinks approximately 4 beers per night.  He has never had pancreatitis before.  He reports his pain was improved after Dilaudid but the pain has returned at this time.  He denies chest pain, shortness of breath, fever, chills, weakness, dizziness, syncope.  The history is provided by the patient and medical records. No language interpreter was used.       History reviewed. No pertinent past medical history.  There are no problems to display for this patient.   Past Surgical History:  Procedure Laterality Date  . SCROTAL EXPLORATION Bilateral 10/22/2013   Procedure: SCROTUM EXPLORATION, Left Orchiectomy, Right Orchiopexy;  Surgeon: Marcine Matar, MD;  Location: [facility] OR;  Service: Urology;  Laterality: Bilateral;       No family history on file.  Social History   Tobacco Use  . Smoking status: Never Smoker  . Smokeless tobacco: Never Used  Substance Use Topics  . Alcohol use: Yes    Comment: occ  . Drug use: Never    Home Medications Prior to Admission medications   Medication Sig Start Date End Date Taking? Authorizing Provider  acetaminophen (TYLENOL) 500 MG tablet Take 1,000 mg by mouth 2 (two) times daily as needed for  moderate pain.    [provider]  HYDROcodone-acetaminophen (NORCO/VICODIN) 5-325 MG tablet Take 2 tablets by mouth every 4 (four) hours as needed for moderate pain or severe pain. 12/27/19   Jaymarie Yeakel, Dahlia Client, PA-C  ondansetron (ZOFRAN ODT) 4 MG disintegrating tablet 4mg  ODT q4 hours prn nausea/vomit 12/27/19   Kiam Bransfield, 02/24/20, PA-C    Allergies    Patient has no known allergies.  Review of Systems   Review of Systems  Constitutional: Negative for appetite change, diaphoresis, fatigue, fever and unexpected weight change.  HENT: Negative for mouth sores.   Eyes: Negative for visual disturbance.  Respiratory: Negative for cough, chest tightness, shortness of breath and wheezing.   Cardiovascular: Negative for chest pain.  Gastrointestinal: Positive for abdominal pain and nausea. Negative for constipation, diarrhea and vomiting.  Endocrine: Negative for polydipsia, polyphagia and polyuria.  Genitourinary: Negative for dysuria, frequency, hematuria and urgency.  Musculoskeletal: Negative for back pain and neck stiffness.  Skin: Negative for rash.  Allergic/Immunologic: Negative for immunocompromised state.  Neurological: Negative for syncope, light-headedness and headaches.  Hematological: Does not bruise/bleed easily.  Psychiatric/Behavioral: Negative for sleep disturbance. The patient is not nervous/anxious.     Physical Exam Updated Vital Signs BP (!) 168/103 (BP Location: Left Arm)   Pulse 70   Temp 98.5 F (36.9 C) (Oral)   Resp 18   SpO2 99%   Physical Exam Vitals and nursing note reviewed.  Constitutional:      General: He is not in acute distress.  Appearance: He is not diaphoretic.  HENT:     Head: Normocephalic.  Eyes:     General: No scleral icterus.    Conjunctiva/sclera: Conjunctivae normal.  Cardiovascular:     Rate and Rhythm: Normal rate and regular rhythm.     Pulses: Normal pulses.          Radial pulses are 2+ on the right side and 2+ on  the left side.  Pulmonary:     Effort: No tachypnea, accessory muscle usage, prolonged expiration, respiratory distress or retractions.     Breath sounds: No stridor.     Comments: Equal chest rise. No increased work of breathing. Abdominal:     General: There is no distension.     Palpations: Abdomen is soft.     Tenderness: There is abdominal tenderness in the epigastric area. There is no right CVA tenderness, left CVA tenderness, guarding or rebound.  Musculoskeletal:     Cervical back: Normal range of motion.     Comments: Moves all extremities equally and without difficulty.  Skin:    General: Skin is warm and dry.     Capillary Refill: Capillary refill takes less than 2 seconds.  Neurological:     Mental Status: He is alert.     GCS: GCS eye subscore is 4. GCS verbal subscore is 5. GCS motor subscore is 6.     Comments: Speech is clear and goal oriented.  Psychiatric:        Mood and Affect: Mood normal.     ED Results / Procedures / Treatments   Labs (all labs ordered are listed, but only abnormal results are displayed) Labs Reviewed  LIPASE, BLOOD - Abnormal; Notable for the following components:      Result Value   Lipase 1,099 (*)    All other components within normal limits  COMPREHENSIVE METABOLIC PANEL - Abnormal; Notable for the following components:   Potassium 3.1 (*)    Chloride 97 (*)    Glucose, Bld 126 (*)    Calcium 8.8 (*)    All other components within normal limits  URINALYSIS, ROUTINE W REFLEX MICROSCOPIC - Abnormal; Notable for the following components:   APPearance HAZY (*)    Ketones, ur 20 (*)    Protein, ur 100 (*)    Bacteria, UA RARE (*)    All other components within normal limits  CBC    Procedures Procedures (including critical care time)  Medications Ordered in ED Medications  sodium chloride flush (NS) 0.9 % injection 3 mL (3 mLs Intravenous Given 12/27/19 0211)  HYDROmorphone (DILAUDID) injection 1 mg (1 mg Intravenous Given  12/27/19 0055)  ondansetron (ZOFRAN) injection 4 mg (4 mg Intravenous Given 12/27/19 0055)  HYDROmorphone (DILAUDID) injection 1 mg (1 mg Intravenous Given 12/27/19 0308)  potassium chloride SA (KLOR-CON) CR tablet 40 mEq (40 mEq Oral Given 12/27/19 0308)  potassium chloride 10 mEq in 100 mL IVPB (0 mEq Intravenous Stopped 12/27/19 0415)    ED Course  I have reviewed the triage vital signs and the nursing notes.  Pertinent labs & imaging results that were available during my care of the patient were reviewed by me and considered in my medical decision making (see chart for details).    MDM Rules/Calculators/A&P                       Patient presents with epigastric abdominal pain after large volume alcohol consumption.  He does regularly drink.  Labs indicative of pancreatitis.  Suspect alcohol induced.  No elevation of AST/ALT.  Less likely to be cholecystitis/choledocholithiasis.  Patient's pain was controlled with Dilaudid initially.  It has returned.  Discussed with patient risk versus benefit of admission versus discharge home.  He would like to be discharged home.  Will give additional pain control and p.o. trial.  4:30 AM Patient reports he is feeling much better.  His pain has resolved and he has been able to drink without difficulty.  No emesis.  No significant pain after drinking.  Discussed admission versus discharge home again.  Patient wishes to be discharged home.  Will discharge with pain medication and nausea.  Discussed reasons to return to the emergency department and patient states understanding.   Final Clinical Impression(s) / ED Diagnoses Final diagnoses:  Alcohol-induced acute pancreatitis, unspecified complication status  Hypokalemia    Rx / DC Orders ED Discharge Orders         Ordered    HYDROcodone-acetaminophen (NORCO/VICODIN) 5-325 MG tablet  Every 4 hours PRN     12/27/19 0454    ondansetron (ZOFRAN ODT) 4 MG disintegrating tablet     12/27/19 0454            Numa Schroeter, Jarrett Soho, PA-C 12/27/19 7915    Ripley Fraise, MD 12/27/19 [phone]

## 2019-12-27 NOTE — ED Notes (Signed)
Pt reporting increased abdominal pain w/ drinking water

## 2019-12-27 NOTE — ED Notes (Signed)
Discharge instructions discussed with pt via tele interpreter. Pt verbalized understanding. Pt stable and ambulatory. No signature pad available

## 2019-12-27 NOTE — Discharge Instructions (Addendum)
1. Medications: zofran, vicodin, usual home medications 2. Treatment: rest, drink plenty of fluids, advance diet slowly; start with clear liquids 3. Follow Up: Please followup with your primary doctor in 2 days for discussion of your diagnoses and further evaluation after today's visit; if you do not have a primary care doctor use the resource guide provided to find one; Please return to the ER for persistent vomiting, high fevers or worsening symptoms

## 2020-04-19 ENCOUNTER — Emergency Department (HOSPITAL_COMMUNITY): Payer: Self-pay

## 2020-04-19 ENCOUNTER — Other Ambulatory Visit: Payer: Self-pay

## 2020-04-19 ENCOUNTER — Emergency Department (HOSPITAL_COMMUNITY)
Admission: EM | Admit: 2020-04-19 | Discharge: 2020-04-20 | Disposition: A | Discharge status: Home / Self Care | Payer: Self-pay | Attending: Emergency Medicine | Admitting: Emergency Medicine

## 2020-04-19 ENCOUNTER — Encounter (HOSPITAL_COMMUNITY): Payer: Self-pay

## 2020-04-19 DIAGNOSIS — K859 Acute pancreatitis without necrosis or infection, unspecified: Secondary | ICD-10-CM | POA: Insufficient documentation

## 2020-04-19 DIAGNOSIS — Z79899 Other long term (current) drug therapy: Secondary | ICD-10-CM | POA: Insufficient documentation

## 2020-04-19 LAB — COMPREHENSIVE METABOLIC PANEL
ALT: 37 U/L (ref 0–44)
AST: 40 U/L (ref 15–41)
Albumin: 4 g/dL (ref 3.5–5.0)
Alkaline Phosphatase: 97 U/L (ref 38–126)
Anion gap: 11 (ref 5–15)
BUN: <5 mg/dL — ABNORMAL LOW (ref 6–20)
CO2: 27 mmol/L (ref 22–32)
Calcium: 8.6 mg/dL — ABNORMAL LOW (ref 8.9–10.3)
Chloride: 95 mmol/L — ABNORMAL LOW (ref 98–111)
Creatinine, Ser: 0.74 mg/dL (ref 0.61–1.24)
GFR calc Af Amer: >60 mL/min (ref >60)
GFR calc non Af Amer: >60 mL/min (ref >60)
Glucose, Bld: 131 mg/dL — ABNORMAL HIGH (ref 70–99)
Potassium: 3.1 mmol/L — ABNORMAL LOW (ref 3.5–5.1)
Sodium: 133 mmol/L — ABNORMAL LOW (ref 135–145)
Total Bilirubin: 0.4 mg/dL (ref 0.3–1.2)
Total Protein: 7.3 g/dL (ref 6.5–8.1)

## 2020-04-19 LAB — URINALYSIS, ROUTINE W REFLEX MICROSCOPIC
Bilirubin Urine: NEGATIVE
Glucose, UA: NEGATIVE mg/dL
Hgb urine dipstick: NEGATIVE
Ketones, ur: NEGATIVE mg/dL
Leukocytes,Ua: NEGATIVE
Nitrite: NEGATIVE
Protein, ur: NEGATIVE mg/dL
Specific Gravity, Urine: 1.008 (ref 1.005–1.030)
pH: 8 (ref 5.0–8.0)

## 2020-04-19 LAB — CBC
HCT: 43 % (ref 39.0–52.0)
Hemoglobin: 14.6 g/dL (ref 13.0–17.0)
MCH: 30.2 pg (ref 26.0–34.0)
MCHC: 34 g/dL (ref 30.0–36.0)
MCV: 89 fL (ref 80.0–100.0)
Platelets: 286 10*3/uL (ref 150–400)
RBC: 4.83 MIL/uL (ref 4.22–5.81)
RDW: 13.8 % (ref 11.5–15.5)
WBC: 10.6 10*3/uL — ABNORMAL HIGH (ref 4.0–10.5)
nRBC: 0 % (ref 0.0–0.2)

## 2020-04-19 LAB — LIPASE, BLOOD: Lipase: 1320 U/L — ABNORMAL HIGH (ref 11–51)

## 2020-04-19 MED ORDER — MORPHINE SULFATE (PF) 4 MG/ML IV SOLN
4.0000 mg | Freq: Once | INTRAVENOUS | Status: AC
  Administered 2020-04-19: 4 mg via INTRAVENOUS
  Filled 2020-04-19: qty 1

## 2020-04-19 MED ORDER — HYDROMORPHONE HCL 1 MG/ML IJ SOLN
0.5000 mg | Freq: Once | INTRAMUSCULAR | Status: AC
  Administered 2020-04-20: 0.5 mg via INTRAVENOUS
  Filled 2020-04-19: qty 1

## 2020-04-19 MED ORDER — SODIUM CHLORIDE 0.9% FLUSH: 3.0000 mL | Freq: Once | INTRAVENOUS | Status: DC

## 2020-04-19 MED ORDER — POTASSIUM CHLORIDE CRYS ER 20 MEQ PO TBCR
40.0000 meq | EXTENDED_RELEASE_TABLET | Freq: Once | ORAL | Status: AC
  Administered 2020-04-20: 40 meq via ORAL
  Filled 2020-04-19: qty 2

## 2020-04-19 MED ORDER — SODIUM CHLORIDE 0.9 % IV BOLUS
1000.0000 mL | Freq: Once | INTRAVENOUS | Status: AC
  Administered 2020-04-19: 1000 mL via INTRAVENOUS

## 2020-04-19 MED ORDER — IOHEXOL 300 MG/ML  SOLN
100.0000 mL | Freq: Once | INTRAMUSCULAR | Status: AC | PRN
  Administered 2020-04-19: 100 mL via INTRAVENOUS

## 2020-04-19 MED ORDER — ONDANSETRON HCL 4 MG/2ML IJ SOLN
4.0000 mg | Freq: Once | INTRAMUSCULAR | Status: AC
  Administered 2020-04-19: 23:00:00 4 mg via INTRAVENOUS
  Filled 2020-04-19: qty 2

## 2020-04-19 NOTE — ED Provider Notes (Signed)
[facility] EMERGENCY DEPARTMENT Provider Note   CSN: [id number] Arrival date & time: 04/19/20  2002     History Chief Complaint  Patient presents with  . Abdominal Pain    Spencer Peterson is a 44 y.o. male with a history of prior left orchiectomy who presents to the ED with complaints of abdominal pain that began around 0700 AM today. Patient states pain is located in the epigastric region radiating to the back, it feels like an aching pain, progressively worsening, currently an 8/10. Pain is aggravated by supine/prone position, no alleviating factors. Reports associated chills and hot sweats. Denies nausea, vomiting, diarrhea, melena, hematochezia, dysuria, or testicular pain. Reports he drinks approximately 3 days per week, usually 3-4 beers at a time, last had EtOH last night. Denies drug use. Denies any medical problems or daily medications, no new OTC meds.  States this feels different from his prior ED visit 12/2019, this is more painful.   Translator line utilized throughout Audiological scientist.   HPI     History reviewed. No pertinent past medical history.  There are no problems to display for this patient.   Past Surgical History:  Procedure Laterality Date  . SCROTAL EXPLORATION Bilateral 10/22/2013   Procedure: SCROTUM EXPLORATION, Left Orchiectomy, Right Orchiopexy;  Surgeon: Marcine Matar, MD;  Location: [facility] OR;  Service: Urology;  Laterality: Bilateral;       History reviewed. No pertinent family history.  Social History   Tobacco Use  . Smoking status: Never Smoker  . Smokeless tobacco: Never Used  Substance Use Topics  . Alcohol use: Yes    Comment: occ  . Drug use: Never    Home Medications Prior to Admission medications   Medication Sig Start Date End Date Taking? Authorizing Provider  acetaminophen (TYLENOL) 500 MG tablet Take 1,000 mg by mouth 2 (two) times daily as needed for moderate pain.    [provider]   HYDROcodone-acetaminophen (NORCO/VICODIN) 5-325 MG tablet Take 2 tablets by mouth every 4 (four) hours as needed for moderate pain or severe pain. 12/27/19   Muthersbaugh, Dahlia Client, PA-C  ondansetron (ZOFRAN ODT) 4 MG disintegrating tablet 4mg  ODT q4 hours prn nausea/vomit 12/27/19   Muthersbaugh, 02/24/20, PA-C    Allergies    Patient has no known allergies.  Review of Systems   Review of Systems  Constitutional: Positive for chills and fever (subjective).  Respiratory: Negative for shortness of breath.   Cardiovascular: Negative for chest pain.  Gastrointestinal: Positive for abdominal pain. Negative for blood in stool, constipation, diarrhea, nausea and vomiting.  Genitourinary: Negative for dysuria, scrotal swelling and testicular pain.  Neurological: Negative for syncope.  All other systems reviewed and are negative.   Physical Exam Updated Vital Signs BP (!) 154/103   Pulse 88   Temp 98.5 F (36.9 C) (Oral)   Resp 16   SpO2 100%   Physical Exam Vitals and nursing note reviewed.  Constitutional:      General: He is not in acute distress.    Appearance: He is well-developed. He is not toxic-appearing.  HENT:     Head: Normocephalic and atraumatic.  Eyes:     General:        Right eye: No discharge.        Left eye: No discharge.     Conjunctiva/sclera: Conjunctivae normal.  Cardiovascular:     Rate and Rhythm: Normal rate and regular rhythm.  Pulmonary:     Effort: Pulmonary effort is normal. No respiratory distress.  Breath sounds: Normal breath sounds. No wheezing, rhonchi or rales.  Abdominal:     General: There is no distension.     Palpations: Abdomen is soft.     Tenderness: There is abdominal tenderness in the epigastric area and periumbilical area. There is no guarding or rebound. Negative signs include Murphy's sign.  Musculoskeletal:     Cervical back: Neck supple.  Skin:    General: Skin is warm and dry.     Findings: No rash.  Neurological:     Mental  Status: He is alert.     Comments: Clear speech.   Psychiatric:        Behavior: Behavior normal.    ED Results / Procedures / Treatments   Labs (all labs ordered are listed, but only abnormal results are displayed) Labs Reviewed  LIPASE, BLOOD - Abnormal; Notable for the following components:      Result Value   Lipase 1,320 (*)    All other components within normal limits  COMPREHENSIVE METABOLIC PANEL - Abnormal; Notable for the following components:   Sodium 133 (*)    Potassium 3.1 (*)    Chloride 95 (*)    Glucose, Bld 131 (*)    BUN <5 (*)    Calcium 8.6 (*)    All other components within normal limits  CBC - Abnormal; Notable for the following components:   WBC 10.6 (*)    All other components within normal limits  URINALYSIS, ROUTINE W REFLEX MICROSCOPIC - Abnormal; Notable for the following components:   Color, Urine STRAW (*)    All other components within normal limits    EKG None  Radiology CT Abdomen Pelvis W Contrast  Result Date: 04/19/2020 CLINICAL DATA:  Pancreatitis suspected, right and left upper quadrant abdominal pain for 1 day, 9/10 pain. EXAM: CT ABDOMEN AND PELVIS WITH CONTRAST TECHNIQUE: Multidetector CT imaging of the abdomen and pelvis was performed using the standard protocol following bolus administration of intravenous contrast. CONTRAST:  OMNIPAQUE IOHEXOL 300 MG/ML  SOLN COMPARISON:  CT chest 06/24/2019 FINDINGS: Lower chest: Dependent ground-glass changes in the lung bases favor atelectasis. No effusion. Normal heart size. No pericardial effusion. Hepatobiliary: Few subcentimeter hypoattenuating foci throughout the liver too small to fully characterize on CT imaging but statistically likely benign. No worrisome focal liver lesions. Smooth liver surface contour. Normal hepatic attenuation. Small calcified gallstone seen layering dependently in the gallbladder. No visible intraductal gallstones. Slight mural thickening of the distal common bile  duct, possibly reactive. Pancreas: Diffuse edematous appearance of the pancreas without regions of hypoattenuation to suggest pancreatic necrosis. There is extensive peripancreatic stranding and reactive edematous fluid along both the intraperitoneal and retroperitoneal pancreatic segments without loculated or organized collection. Spleen: Normal in size without focal abnormality. Adrenals/Urinary Tract: Normal adrenal glands. Kidneys are normally located with symmetric enhancementand excretion. No suspicious renal lesion, urolithiasis or hydronephrosis. No stone some mild bladder wall thickening without significant perivesicular stranding. Stomach/Bowel: Distal esophagus and stomach is normal. There is stranding along the gastric antrum and duodenal sweep with mild mural thickening of the duodenum as it crosses midline, favored to be reactive to the pancreatic findings above. More distal small bowel has a normal appearance. A normal appendix is visualized. Proximal colon is unremarkable. Slight thickening and pericolonic stranding adjacent the descending colon is likely secondary to the adjacent pancreatic inflammation as well. More distal colon is unremarkable. Vascular/Lymphatic: The aorta is normal caliber. No pathologically enlarged abdominopelvic nodes. Reproductive: The prostate and seminal  vesicles are unremarkable. Other: Peripancreatic inflammation and likely reactive free fluid within the retroperitoneum and along the left pericolic gutter, as detailed above. No abdominopelvic free air. No bowel containing hernias. Musculoskeletal: No acute osseous abnormality or suspicious osseous lesion. IMPRESSION: 1. Diffuse edematous appearance of the pancreas without regions of hypoattenuation to suggest pancreatic necrosis. Findings are compatible with acute interstitial edematous pancreatitis. No features of pancreatic necrosis or organized acute peripancreatic collections. 2. Cholelithiasis without CT evidence of  acute cholecystitis. Mild mural thickening of the distal common bile duct, possibly reactive. 3. Mild thickening and stranding about the gastric antrum, duodenum and portion of the descending colon is favored to be reactive and secondary to the pancreatic inflammation above. 4. Mild bladder wall thickening but without significant perivesicular stranding. Recommend clinical assessment for urinary symptoms. Electronically Signed   By: Kreg Shropshire M.D.   On: 04/19/2020 23:44    Procedures Procedures (including critical care time)  Medications Ordered in ED Medications  sodium chloride flush (NS) 0.9 % injection 3 mL (has no administration in time range)    ED Course  I have reviewed the triage vital signs and the nursing notes.  Pertinent labs & imaging results that were available during my care of the patient were reviewed by me and considered in my medical decision making (see chart for details).    MDM Rules/Calculators/A&P                     Patient presents to the ED with complaints of abdominal pain that began this AM. Nontoxic, vitals with elevated BP- doubt HTN emergency, otherwise WNL. Exam with epigastric & periumbilical abdominal tenderness, no peritoneal signs.   DDx: Pancreatitis, cholecystitis, cholelithiasis, GERD, PUD, gastritis, appendicitis, perf, obstruction.   Additional history obtained:  Previous records obtained and reviewed- prior episode of acute pancreatitis 12/2019.  Lab Tests:  I Ordered, reviewed, and interpreted labs, which included:  CBC: Mild leukocytosis @ 10.6. No anemia.  CMP: Mild electrolyte abnormalities- Hypo K, Ca, NA, Cl. LFts & creatinine WNL.  Lipase: Grossly elevated @ 1320 UA: No UTI or hematuria.   Imaging Studies ordered:  I ordered imaging studies which included CT A/P, I independently visualized and interpreted imaging which showed:   1. Diffuse edematous appearance of the pancreas without regions of hypoattenuation to suggest  pancreatic necrosis. Findings are compatible with acute interstitial edematous pancreatitis. No features of pancreatic necrosis or organized acute peripancreatic collections. 2. Cholelithiasis without CT evidence of acute cholecystitis. Mild mural thickening of the distal common bile duct, possibly reactive. 3. Mild thickening and stranding about the gastric antrum, duodenum and portion of the descending colon is favored to be reactive and secondary to the pancreatic inflammation above. 4. Mild bladder wall thickening but without significant perivesicular stranding. Recommend clinical assessment for urinary symptoms  ED Course:  23:00: Initial evaluation, pain 8/10 in severity, morphine, zofran, & fluids ordered.   CT A/P with findings consistent with acute pancreatitis, no features of pancreatic necrosis or organized peripancreatic collections which correlates with patient's H&P as well as his lipase. Suspect alcohol induced.   23:55: RE-EVAL: Patient feeling improved, states his pain is a 3 out of 10 in severity, he states he would ultimately prefer to go home in terms of disposition, requesting a small amount more pain medicine if possible.  Will give 0.5 mg of Dilaudid, oral potassium, and p.o. challenge.  00:30: RE- EVAL: Patient feeling much better, he is tolerating p.o., he is requesting to  go home.  Abdomen remains without peritoneal signs.  No signs of necrosis or fluid collection on CT abdomen/pelvis.  Patient without significant high risk features.  Overall feel he is appropriate for discharge home at this time.  Will provide analgesics, antiemetics, and potassium supplementation.  Recommended clear liquid diet for the next 48 hours with slow advancement.  We discussed decrease of alcohol use as this is likely contributing to his pancreatitis. I discussed results, treatment plan, need for follow-up, and return precautions with the patient. Provided opportunity for questions, patient confirmed  understanding and is in agreement with plan.   Portions of this note were generated with Lobbyist. Dictation errors may occur despite best attempts at proofreading.  Final Clinical Impression(s) / ED Diagnoses Final diagnoses:  Acute pancreatitis without infection or necrosis, unspecified pancreatitis type    Rx / DC Orders ED Discharge Orders         Ordered    HYDROcodone-acetaminophen (NORCO/VICODIN) 5-325 MG tablet  Every 6 hours PRN     04/20/20 0040    ondansetron (ZOFRAN ODT) 4 MG disintegrating tablet  Every 8 hours PRN     04/20/20 0040    potassium chloride SA (KLOR-CON) 20 MEQ tablet  Daily     04/20/20 0040           Amaryllis Dyke, PA-C 04/20/20 0045    Merryl Hacker, MD 04/20/20 2307

## 2020-04-19 NOTE — ED Notes (Signed)
Pt transported CT via stretcher.

## 2020-04-19 NOTE — ED Triage Notes (Signed)
Pt arrives to ED c/o R/LUQ abdominal pain x 1 day. Pt denies n/v/d. Pt reports 9/10 pain.

## 2020-04-19 NOTE — ED Notes (Signed)
Pt returned from CT °

## 2020-04-20 MED ORDER — POTASSIUM CHLORIDE CRYS ER 20 MEQ PO TBCR
20.0000 meq | EXTENDED_RELEASE_TABLET | Freq: Every day | ORAL | 0 refills | Status: DC
Start: 2020-04-20 — End: 2021-05-25

## 2020-04-20 MED ORDER — HYDROCODONE-ACETAMINOPHEN 5-325 MG PO TABS: 1.0000 | ORAL_TABLET | Freq: Four times a day (QID) | ORAL | 0 refills | Status: DC | PRN

## 2020-04-20 MED ORDER — ONDANSETRON 4 MG PO TBDP
4.0000 mg | ORAL_TABLET | Freq: Three times a day (TID) | ORAL | 0 refills | Status: DC | PRN
Start: 2020-04-20 — End: 2020-07-30

## 2020-04-20 NOTE — Discharge Instructions (Addendum)
You were seen in the emergency department today for abdominal pain.  Your labs and CT scan showed findings consistent with acute pancreatitis.  This is potentially due to alcohol consumption.  Your potassium was also somewhat low.  We are sending you home with the following medicines:  -Norco-this is a narcotic/controlled substance medication that has potential addicting qualities.  We recommend that you take 1-2 tablets every 6 hours as needed for severe pain.  Do not drive or operate heavy machinery when taking this medicine as it can be sedating. Do not drink alcohol or take other sedating medications when taking this medicine for safety reasons.  Keep this out of reach of small children.  Please be aware this medicine has Tylenol in it (325 mg/tab) do not exceed the maximum dose of Tylenol in a day per over the counter recommendations should you decide to supplement with Tylenol over the counter.   -Zofran-this is a medication to help with nausea and vomiting, take every 8 hours as needed.  -Potassium supplement-this is a medication to help supplement your potassium that was low in the emergency department.  Please take this daily for the next few days.  We have prescribed you new medication(s) today. Discuss the medications prescribed today with your pharmacist as they can have adverse effects and interactions with your other medicines including over the counter and prescribed medications. Seek medical evaluation if you start to experience new or abnormal symptoms after taking one of these medicines, seek care immediately if you start to experience difficulty breathing, feeling of your throat closing, facial swelling, or rash as these could be indications of a more serious allergic reaction  Please follow the clear liquid diet guidelines for the next 48 hours and slowly advance her diet. Please follow-up with your primary care provider within 3 days for reevaluation.  Return to the ER for new or  worsening symptoms including but not limited to worsening pain, fever, inability to keep fluids down, blood in vomit, blood in your stool, or any other concerns.  Please try to decrease your alcohol intake as this is likely contributing to your pancreatitis.  [employer] English to [employer] te vieron en el departamento de emergencias por dolor abdominal. Sus anlisis de laboratorio y tomografa computarizada mostraron hallazgos compatibles con pancreatitis aguda. Esto se debe potencialmente al consumo de alcohol. Tu potasio tambin estaba algo bajo. Lo enviamos a casa con los siguientes medicamentos:  -Norco - este es un medicamento narctico / sustancia controlada que tiene potenciales cualidades [place]. Le recomendamos que tome 1-2 comprimidos cada 6 horas segn sea necesario para el dolor intenso. No conduzca ni maneje maquinaria pesada mientras toma este medicamento, ya que puede ser sedante. No beba alcohol ni tome otros medicamentos sedantes cuando est tomando este medicamento por razones de seguridad. Mantenga esto fuera del alcance de los nios pequeos. Tenga en cuenta que este medicamento contiene Tylenol (325 mg / tab) no exceda la dosis mxima de Tylenol en un da segn las recomendaciones de venta libre en caso de que decida complementar con Tylenol de venta libre.  -Zofran: este es un medicamento para ayudar con las nuseas y los vmitos, tmelo cada 8 horas segn sea necesario.  -Suplemento de potasio: este es un medicamento para ayudar a IT trainer potasio que estaba bajo en el departamento de emergencias. Tmelo todos los [employer]  [address]  Le hemos recetado nuevos medicamentos hoy. Discuta los medicamentos recetados hoy con su farmacutico, ya que pueden  tener efectos adversos e interacciones con sus otros medicamentos, incluidos los medicamentos recetados y de [place]. Busque una evaluacin mdica si comienza a experimentar sntomas nuevos o  anormales despus de tomar uno de estos medicamentos, busque atencin mdica de inmediato si comienza a experimentar dificultad para respirar, sensacin de cierre de la garganta, hinchazn facial o sarpullido, ya que estos podran ser indicios de un problema ms grave reaccin alrgica  Siga las pautas de la dieta de lquidos claros durante las prximas 31 horas y avance lentamente su dieta. Haga un seguimiento con su proveedor de atencin primaria dentro de los 3 das para una reevaluacin. Regrese a la sala de emergencias si tiene sntomas nuevos o que [place], que [place], [place] otros, empeoramiento del [place], [place], incapacidad para retener lquidos, sangre en el vmito, sangre en las heces o cualquier otra inquietud.  Intente disminuir su consumo de alcohol, ya que probablemente esto contribuya a su pancreatitis.

## 2020-04-20 NOTE — ED Notes (Signed)
Patient verbalizes understanding of discharge instructions. Opportunity for questioning and answers were provided. Armband removed by staff, pt discharged from ED.  

## 2020-07-29 ENCOUNTER — Emergency Department (HOSPITAL_COMMUNITY)
Admission: EM | Admit: 2020-07-29 | Discharge: 2020-07-30 | Disposition: A | Discharge status: Home / Self Care | Payer: Self-pay | Attending: Emergency Medicine | Admitting: Emergency Medicine

## 2020-07-29 ENCOUNTER — Encounter (HOSPITAL_COMMUNITY): Payer: Self-pay

## 2020-07-29 DIAGNOSIS — R110 Nausea: Secondary | ICD-10-CM | POA: Insufficient documentation

## 2020-07-29 DIAGNOSIS — K852 Alcohol induced acute pancreatitis without necrosis or infection: Secondary | ICD-10-CM | POA: Insufficient documentation

## 2020-07-29 LAB — COMPREHENSIVE METABOLIC PANEL
ALT: 37 U/L (ref 0–44)
AST: 41 U/L (ref 15–41)
Albumin: 4.2 g/dL (ref 3.5–5.0)
Alkaline Phosphatase: 85 U/L (ref 38–126)
Anion gap: 12 (ref 5–15)
BUN: 11 mg/dL (ref 6–20)
CO2: 23 mmol/L (ref 22–32)
Calcium: 9 mg/dL (ref 8.9–10.3)
Chloride: 100 mmol/L (ref 98–111)
Creatinine, Ser: 0.84 mg/dL (ref 0.61–1.24)
GFR calc Af Amer: >60 mL/min (ref >60)
GFR calc non Af Amer: >60 mL/min (ref >60)
Glucose, Bld: 142 mg/dL — ABNORMAL HIGH (ref 70–99)
Potassium: 3.6 mmol/L (ref 3.5–5.1)
Sodium: 135 mmol/L (ref 135–145)
Total Bilirubin: 0.6 mg/dL (ref 0.3–1.2)
Total Protein: 7.5 g/dL (ref 6.5–8.1)

## 2020-07-29 LAB — URINALYSIS, ROUTINE W REFLEX MICROSCOPIC
Bacteria, UA: NONE SEEN
Bilirubin Urine: NEGATIVE
Glucose, UA: NEGATIVE mg/dL
Ketones, ur: 80 mg/dL — AB
Leukocytes,Ua: NEGATIVE
Nitrite: NEGATIVE
Protein, ur: 30 mg/dL — AB
Specific Gravity, Urine: 1.017 (ref 1.005–1.030)
pH: 6 (ref 5.0–8.0)

## 2020-07-29 LAB — CBC
HCT: 43.9 % (ref 39.0–52.0)
Hemoglobin: 14.8 g/dL (ref 13.0–17.0)
MCH: 31.2 pg (ref 26.0–34.0)
MCHC: 33.7 g/dL (ref 30.0–36.0)
MCV: 92.4 fL (ref 80.0–100.0)
Platelets: 285 10*3/uL (ref 150–400)
RBC: 4.75 MIL/uL (ref 4.22–5.81)
RDW: 13.3 % (ref 11.5–15.5)
WBC: 11.1 10*3/uL — ABNORMAL HIGH (ref 4.0–10.5)
nRBC: 0 % (ref 0.0–0.2)

## 2020-07-29 LAB — LIPASE, BLOOD: Lipase: 2045 U/L — ABNORMAL HIGH (ref 11–51)

## 2020-07-29 NOTE — ED Triage Notes (Signed)
Triage completed w/ spanish interpreter.   Pt arrives to ED w/ c/o 10/10 R/LUQ abdominal pain that started this morning. Pt denies n/v/d, sob, chest pain, hematuria.

## 2020-07-30 MED ORDER — ONDANSETRON HCL 4 MG PO TABS
4.0000 mg | ORAL_TABLET | Freq: Four times a day (QID) | ORAL | 0 refills | Status: DC | PRN
Start: 2020-07-30 — End: 2021-05-25

## 2020-07-30 MED ORDER — SODIUM CHLORIDE 0.9 % IV BOLUS
1000.0000 mL | Freq: Once | INTRAVENOUS | Status: AC
  Administered 2020-07-30: 1000 mL via INTRAVENOUS

## 2020-07-30 MED ORDER — MORPHINE SULFATE (PF) 4 MG/ML IV SOLN
4.0000 mg | Freq: Once | INTRAVENOUS | Status: AC
  Administered 2020-07-30: 4 mg via INTRAVENOUS
  Filled 2020-07-30: qty 1

## 2020-07-30 MED ORDER — HYDROCODONE-ACETAMINOPHEN 5-325 MG PO TABS: 1.0000 | ORAL_TABLET | Freq: Four times a day (QID) | ORAL | 0 refills | Status: DC | PRN

## 2020-07-30 MED ORDER — ONDANSETRON HCL 4 MG/2ML IJ SOLN
4.0000 mg | Freq: Once | INTRAMUSCULAR | Status: AC
  Administered 2020-07-30: 4 mg via INTRAVENOUS
  Filled 2020-07-30: qty 2

## 2020-07-30 NOTE — ED Notes (Signed)
Patient called x4 no reply. Went outside to call for, no reply. Called mobile, straight to voicemail.

## 2020-07-30 NOTE — ED Notes (Signed)
Discharge instructions discussed with pt. Pt verbalized understanding. Pt stable and ambulatory. No signature pad available. 

## 2020-07-30 NOTE — ED Provider Notes (Signed)
[facility] EMERGENCY DEPARTMENT Provider Note   CSN: [id number] Arrival date & time: 07/29/20  1831   History Chief Complaint  Patient presents with  . Abdominal Pain    Spencer Peterson is a 44 y.o. male.  The history is provided by the patient. A language interpreter was used.  Abdominal Pain He has history of alcoholic pancreatitis and comes in with severe pain in the epigastric area and right upper quadrant with radiation to the back.  There is associated nausea but no vomiting.  He does admit to having had 3 beers yesterday.  He has not taken anything for pain at home.  He denies any urinary difficulty, and denies constipation or diarrhea.  History reviewed. No pertinent past medical history.  There are no problems to display for this patient.   Past Surgical History:  Procedure Laterality Date  . SCROTAL EXPLORATION Bilateral 10/22/2013   Procedure: SCROTUM EXPLORATION, Left Orchiectomy, Right Orchiopexy;  Surgeon: Marcine Matar, MD;  Location: [facility] OR;  Service: Urology;  Laterality: Bilateral;       No family history on file.  Social History   Tobacco Use  . Smoking status: Never Smoker  . Smokeless tobacco: Never Used  Substance Use Topics  . Alcohol use: Yes    Comment: occ  . Drug use: Never    Home Medications Prior to Admission medications   Medication Sig Start Date End Date Taking? Authorizing Provider  acetaminophen (TYLENOL) 500 MG tablet Take 1,000 mg by mouth 2 (two) times daily as needed for moderate pain.    [provider]  HYDROcodone-acetaminophen (NORCO/VICODIN) 5-325 MG tablet Take 1-2 tablets by mouth every 6 (six) hours as needed. 04/20/20   Petrucelli, Samantha R, PA-C  ondansetron (ZOFRAN ODT) 4 MG disintegrating tablet Take 1 tablet (4 mg total) by mouth every 8 (eight) hours as needed for nausea or vomiting. 04/20/20   Petrucelli, Samantha R, PA-C  potassium chloride SA (KLOR-CON) 20 MEQ tablet Take 1 tablet (20 mEq  total) by mouth daily. 04/20/20   Petrucelli, Pleas Koch, PA-C    Allergies    Patient has no known allergies.  Review of Systems   Review of Systems  Gastrointestinal: Positive for abdominal pain.  All other systems reviewed and are negative.   Physical Exam Updated Vital Signs BP 140/87 (BP Location: Left Arm)   Pulse 68   Temp 98.2 F (36.8 C) (Oral)   Resp 16   SpO2 100%   Physical Exam Vitals and nursing note reviewed.   44 year old male, in obvious pain. Vital signs are significant for borderline elevated blood pressure. Oxygen saturation is 100%, which is normal. Head is normocephalic and atraumatic. PERRLA, EOMI. Oropharynx is clear. Neck is nontender and supple without adenopathy or JVD. Back is nontender and there is no CVA tenderness. Lungs are clear without rales, wheezes, or rhonchi. Chest is nontender. Heart has regular rate and rhythm without murmur. Abdomen is soft, flat, with moderate epigastric and right upper quadrant tenderness.  There is no rebound or guarding.  There are no masses or hepatosplenomegaly and peristalsis is hypoactive. Extremities have no cyanosis or edema, full range of motion is present. Skin is warm and dry without rash. Neurologic: Mental status is normal, cranial nerves are intact, there are no motor or sensory deficits.  ED Results / Procedures / Treatments   Labs (all labs ordered are listed, but only abnormal results are displayed) Labs Reviewed  LIPASE, BLOOD - Abnormal; Notable for  the following components:      Result Value   Lipase 2,045 (*)    All other components within normal limits  COMPREHENSIVE METABOLIC PANEL - Abnormal; Notable for the following components:   Glucose, Bld 142 (*)    All other components within normal limits  CBC - Abnormal; Notable for the following components:   WBC 11.1 (*)    All other components within normal limits  URINALYSIS, ROUTINE W REFLEX MICROSCOPIC - Abnormal; Notable for the  following components:   Hgb urine dipstick SMALL (*)    Ketones, ur 80 (*)    Protein, ur 30 (*)    All other components within normal limits   Procedures Procedures   Medications Ordered in ED Medications  sodium chloride 0.9 % bolus 1,000 mL (0 mLs Intravenous Stopped 07/30/20 0416)  morphine 4 MG/ML injection 4 mg (4 mg Intravenous Given 07/30/20 0320)  ondansetron (ZOFRAN) injection 4 mg (4 mg Intravenous Given 07/30/20 0320)  morphine 4 MG/ML injection 4 mg (4 mg Intravenous Given 07/30/20 0415)    ED Course  I have reviewed the triage vital signs and the nursing notes.  Pertinent labs & imaging results that were available during my care of the patient were reviewed by me and considered in my medical decision making (see chart for details).  MDM Rules/Calculators/A&P Abdominal pain consistent with acute pancreatitis.  Differential diagnosis includes biliary colic, peptic ulcer disease, diverticulitis.  These to include conditions with significant morbidity and mortality.  Labs show markedly elevated lipase consistent with pancreatitis.  No elevation of transaminases, bilirubin, alkaline phosphatase to suggest choledocholithiasis.  Old records are reviewed, and he has several ED visits for pancreatitis, but lipase has not been this high previously.  CT of abdomen and pelvis on 04/19/2020 did show cholelithiasis.  However, given the clinical presentation, I do not feel imaging is necessary.  He will be given IV fluids, morphine, ondansetron and reassessed.  He had good relief pain with above-noted treatment.  With past episodes of pancreatitis, he had been discharged with a short course of hydrocodone-acetaminophen.  He is given a prescription for a small number of hydrocodone-acetaminophen tablets as well as a prescription for ondansetron.  Advised to abstain from ethanol.  Final Clinical Impression(s) / ED Diagnoses Final diagnoses:  Alcohol-induced acute pancreatitis without infection or  necrosis    Rx / DC Orders ED Discharge Orders         Ordered    HYDROcodone-acetaminophen (NORCO/VICODIN) 5-325 MG tablet  Every 6 hours PRN     Discontinue  Reprint     07/30/20 0447    ondansetron (ZOFRAN) 4 MG tablet  Every 6 hours PRN     Discontinue  Reprint     07/30/20 0447           Dione Booze, MD 07/30/20 0448

## 2020-07-30 NOTE — Discharge Instructions (Signed)
No beba nada que contenga alcohol. Si lo hace, tendr otro episodio de pancreatitis.

## 2020-10-19 ENCOUNTER — Ambulatory Visit: Payer: Self-pay | Attending: Nurse Practitioner | Admitting: Nurse Practitioner

## 2020-10-19 ENCOUNTER — Other Ambulatory Visit: Payer: Self-pay

## 2021-05-24 ENCOUNTER — Other Ambulatory Visit: Payer: Self-pay

## 2021-05-24 ENCOUNTER — Emergency Department (HOSPITAL_COMMUNITY)
Admission: EM | Admit: 2021-05-24 | Discharge: 2021-05-25 | Disposition: A | Discharge status: Home / Self Care | Payer: Self-pay | Attending: Emergency Medicine | Admitting: Emergency Medicine

## 2021-05-24 ENCOUNTER — Emergency Department (HOSPITAL_COMMUNITY): Payer: Self-pay

## 2021-05-24 DIAGNOSIS — K859 Acute pancreatitis without necrosis or infection, unspecified: Secondary | ICD-10-CM | POA: Insufficient documentation

## 2021-05-24 LAB — COMPREHENSIVE METABOLIC PANEL
ALT: 21 U/L (ref 0–44)
AST: 31 U/L (ref 15–41)
Albumin: 4.1 g/dL (ref 3.5–5.0)
Alkaline Phosphatase: 91 U/L (ref 38–126)
Anion gap: 9 (ref 5–15)
BUN: 12 mg/dL (ref 6–20)
CO2: 26 mmol/L (ref 22–32)
Calcium: 9.1 mg/dL (ref 8.9–10.3)
Chloride: 101 mmol/L (ref 98–111)
Creatinine, Ser: 0.73 mg/dL (ref 0.61–1.24)
GFR, Estimated: >60 mL/min (ref >60)
Glucose, Bld: 167 mg/dL — ABNORMAL HIGH (ref 70–99)
Potassium: 3.4 mmol/L — ABNORMAL LOW (ref 3.5–5.1)
Sodium: 136 mmol/L (ref 135–145)
Total Bilirubin: 0.6 mg/dL (ref 0.3–1.2)
Total Protein: 7.2 g/dL (ref 6.5–8.1)

## 2021-05-24 LAB — LIPASE, BLOOD: Lipase: 792 U/L — ABNORMAL HIGH (ref 11–51)

## 2021-05-24 LAB — CBC
HCT: 43.8 % (ref 39.0–52.0)
Hemoglobin: 14.8 g/dL (ref 13.0–17.0)
MCH: 30.6 pg (ref 26.0–34.0)
MCHC: 33.8 g/dL (ref 30.0–36.0)
MCV: 90.7 fL (ref 80.0–100.0)
Platelets: 319 10*3/uL (ref 150–400)
RBC: 4.83 MIL/uL (ref 4.22–5.81)
RDW: 13.3 % (ref 11.5–15.5)
WBC: 11.6 10*3/uL — ABNORMAL HIGH (ref 4.0–10.5)
nRBC: 0 % (ref 0.0–0.2)

## 2021-05-24 MED ORDER — IOHEXOL 300 MG/ML  SOLN
100.0000 mL | Freq: Once | INTRAMUSCULAR | Status: AC | PRN
  Administered 2021-05-24: 100 mL via INTRAVENOUS

## 2021-05-24 NOTE — ED Triage Notes (Signed)
Pt reports three hours of central abdominal pain. Emesis x 1 and no diarrhea.

## 2021-05-24 NOTE — ED Provider Notes (Signed)
Emergency Medicine Provider Triage Evaluation Note  Spencer Peterson , a 45 y.o. male  was evaluated in triage.  Pt complains of abdominal pain.  Pain started three hours ago.  Pain in upper abdomen.  Vomited once.  No diarrhea.  Feels similar to pancreatitis before.  Admits to alcohol use. .  Review of Systems  Positive: Abdominal pain, vomiting Negative: fever  Physical Exam  BP (!) 141/95 (BP Location: Right Arm)   Pulse 62   Temp (!) 97.5 F (36.4 C) (Oral)   Resp 20   SpO2 100%  Gen:   Awake, no distress   Resp:  Normal effort  MSK:   Moves extremities without difficulty  Other:  Midline upper ABD/epigastric TTP.   Medical Decision Making  Medically screening exam initiated at 1:40 PM.  Appropriate orders placed.  Spencer Peterson was informed that the remainder of the evaluation will be completed by another provider, this initial triage assessment does not replace that evaluation, and the importance of remaining in the ED until their evaluation is complete.     Cristina Gong, [place] 05/24/21 1341    Arby Barrette, MD 05/31/21 2128

## 2021-05-25 MED ORDER — ONDANSETRON 8 MG PO TBDP
8.0000 mg | ORAL_TABLET | Freq: Three times a day (TID) | ORAL | 0 refills | Status: DC | PRN
Start: 2021-05-25 — End: 2021-10-29

## 2021-05-25 MED ORDER — HYDROCODONE-ACETAMINOPHEN 5-325 MG PO TABS: 1.0000 | ORAL_TABLET | Freq: Four times a day (QID) | ORAL | 0 refills | Status: DC | PRN

## 2021-05-25 MED ORDER — HYDROMORPHONE HCL 1 MG/ML IJ SOLN
1.0000 mg | Freq: Once | INTRAMUSCULAR | Status: AC
  Administered 2021-05-25: 1 mg via INTRAVENOUS
  Filled 2021-05-25: qty 1

## 2021-05-25 MED ORDER — SODIUM CHLORIDE 0.9 % IV BOLUS (SEPSIS)
1000.0000 mL | Freq: Once | INTRAVENOUS | Status: AC
  Administered 2021-05-25: 1000 mL via INTRAVENOUS

## 2021-05-25 MED ORDER — ONDANSETRON HCL 4 MG/2ML IJ SOLN
4.0000 mg | Freq: Once | INTRAMUSCULAR | Status: AC
  Administered 2021-05-25: 4 mg via INTRAVENOUS
  Filled 2021-05-25: qty 2

## 2021-05-25 NOTE — ED Notes (Signed)
Pt provided PO fluids.

## 2021-05-25 NOTE — ED Provider Notes (Signed)
[facility] EMERGENCY DEPARTMENT Provider Note   CSN: [id number] Arrival date & time: 05/24/21  1202     History Chief Complaint  Patient presents with  . Abdominal Pain    Spencer Peterson is a 45 y.o. male.  The history is provided by the patient.  Abdominal Pain Pain location:  Epigastric Pain quality comment:  Unable to describe Pain radiates to:  Back Pain severity:  Severe Onset quality:  Gradual Timing:  Constant Progression:  Worsening Chronicity:  Recurrent Relieved by:  Nothing Worsened by:  Movement and palpation Associated symptoms: nausea and vomiting   Associated symptoms: no chest pain, no diarrhea and no fever   Patient w/previous history of pancreatitis presents with upper abdominal pain and radiates to his back.       PMH-pancreatitis Past Surgical History:  Procedure Laterality Date  . SCROTAL EXPLORATION Bilateral 10/22/2013   Procedure: SCROTUM EXPLORATION, Left Orchiectomy, Right Orchiopexy;  Surgeon: Marcine Matar, MD;  Location: [facility] OR;  Service: Urology;  Laterality: Bilateral;       No family history on file.  Social History   Tobacco Use  . Smoking status: Never Smoker  . Smokeless tobacco: Never Used  Substance Use Topics  . Alcohol use: Yes    Comment: occ  . Drug use: Never    Home Medications Prior to Admission medications   Medication Sig Start Date End Date Taking? Authorizing Provider  acetaminophen (TYLENOL) 500 MG tablet Take 1,000 mg by mouth 2 (two) times daily as needed for moderate pain.    [provider]  HYDROcodone-acetaminophen (NORCO/VICODIN) 5-325 MG tablet Take 1-2 tablets by mouth every 6 (six) hours as needed. 07/30/20   Dione Booze, MD  ondansetron (ZOFRAN) 4 MG tablet Take 1 tablet (4 mg total) by mouth every 6 (six) hours as needed for nausea or vomiting. 07/30/20   Dione Booze, MD  potassium chloride SA (KLOR-CON) 20 MEQ tablet Take 1 tablet (20 mEq total) by mouth daily. 04/20/20    Petrucelli, Pleas Koch, PA-C    Allergies    Patient has no known allergies.  Review of Systems   Review of Systems  Constitutional: Negative for fever.  Cardiovascular: Negative for chest pain.  Gastrointestinal: Positive for abdominal pain, nausea and vomiting. Negative for diarrhea.  All other systems reviewed and are negative.   Physical Exam Updated Vital Signs BP (!) 143/85 (BP Location: Left Arm)   Pulse 62   Temp (!) 97.5 F (36.4 C) (Oral)   Resp 15   SpO2 100%   Physical Exam CONSTITUTIONAL: Well developed/well nourished, comfortable HEAD: Normocephalic/atraumatic EYES: EOMI/PERRL ENMT: Mucous membranes moist NECK: supple no meningeal signs SPINE/BACK:entire spine nontender CV: S1/S2 noted, no murmurs/rubs/gallops noted LUNGS: Lungs are clear to auscultation bilaterally, no apparent distress ABDOMEN: soft, moderate epigastric tenderness, no rebound or guarding, bowel sounds noted throughout abdomen GU:no cva tenderness NEURO: Pt is awake/alert/appropriate, moves all extremitiesx4.  No facial droop.   EXTREMITIES: pulses normal/equal, full ROM SKIN: warm, color normal PSYCH: Patient is anxious  ED Results / Procedures / Treatments   Labs (all labs ordered are listed, but only abnormal results are displayed) Labs Reviewed  LIPASE, BLOOD - Abnormal; Notable for the following components:      Result Value   Lipase 792 (*)    All other components within normal limits  COMPREHENSIVE METABOLIC PANEL - Abnormal; Notable for the following components:   Potassium 3.4 (*)    Glucose, Bld 167 (*)  All other components within normal limits  CBC - Abnormal; Notable for the following components:   WBC 11.6 (*)    All other components within normal limits    EKG None  Radiology CT Abdomen Pelvis W Contrast  Result Date: 05/24/2021 CLINICAL DATA:  Acute pancreatitis, central abdominal pain, vomiting EXAM: CT ABDOMEN AND PELVIS WITH CONTRAST TECHNIQUE:  Multidetector CT imaging of the abdomen and pelvis was performed using the standard protocol following bolus administration of intravenous contrast. CONTRAST:  OMNIPAQUE IOHEXOL 300 MG/ML  SOLN COMPARISON:  04/19/2020, CT chest 06/24/2019 FINDINGS: Lower chest: Noncalcified right middle lobe and left lower lobe pulmonary nodules appear stable since remote prior examination. The lungs are otherwise clear. The visualized heart and pericardium are unremarkable. Hepatobiliary: Tiny scattered cysts are seen throughout the liver. The liver is otherwise unremarkable. No intra or extrahepatic biliary ductal dilation. Gallbladder unremarkable. Pancreas: There is moderate peripancreatic inflammatory change and edema in keeping with changes of acute pancreatitis encompassing the entire pancreas. There is normal enhancement of the pancreatic parenchyma. The pancreatic duct is not dilated. No parenchymal calcifications are seen. No loculated peripancreatic fluid collections are present. Altogether, the findings are most in keeping with acute interstitial/edematous pancreatitis. Spleen: Unremarkable Adrenals/Urinary Tract: Adrenal glands are unremarkable. Kidneys are normal, without renal calculi, focal lesion, or hydronephrosis. Bladder is unremarkable. Stomach/Bowel: Stomach is within normal limits. Appendix appears normal. No evidence of bowel wall thickening, distention, or inflammatory changes. No free intraperitoneal gas or fluid. Vascular/Lymphatic: The abdominal vasculature is unremarkable. Specifically, the superior mesenteric vein, splenic vein, and portal vein are patent. The celiac axis and proximal superior mesenteric artery appear unremarkable on this non arteriographic study. No pathologic adenopathy within the abdomen and pelvis. Reproductive: Prostate is unremarkable. Other: Tiny fat containing umbilical hernia.  Rectum unremarkable. Musculoskeletal: No acute bone abnormality. IMPRESSION: Acute,  uncomplicated, interstitial/edematous pancreatitis. Electronically Signed   By: Helyn Numbers MD   On: 05/24/2021 19:58    Procedures Procedures   Medications Ordered in ED Medications  iohexol (OMNIPAQUE) 300 MG/ML solution 100 mL (100 mLs Intravenous Contrast Given 05/24/21 1916)  HYDROmorphone (DILAUDID) injection 1 mg (1 mg Intravenous Given 05/25/21 0228)  ondansetron (ZOFRAN) injection 4 mg (4 mg Intravenous Given 05/25/21 0229)  sodium chloride 0.9 % bolus 1,000 mL (0 mLs Intravenous Stopped 05/25/21 0327)    ED Course  I have reviewed the triage vital signs and the nursing notes.  Pertinent labs & imaging results that were available during my care of the patient were reviewed by me and considered in my medical decision making (see chart for details).    MDM Rules/Calculators/A&P                          2:06 AM Patient previous history of pancreatitis presents with abdominal pain that moves to his back.  CT imaging and labs confirm acute pancreatitis.  There are no complicating features.  Will treat pain and reassess 5:25 AM Patient feels improved, resting comfortably.  He is taking fluids without difficulty.  No vomiting. Utilize Spanish interpreter Mena Pauls [phone] We discussed lab and CT findings.  Patient reports he has had this before.  He admits to drinking alcohol recently.  I advised patient to cut back on alcohol usage, and we discussed diet plan.  He is appropriate for outpatient management.  Zofran and Vicodin have been provided, and will give referral to PCP We discussed strict return precaution Final Clinical Impression(s) / ED  Diagnoses Final diagnoses:  Acute pancreatitis without infection or necrosis, unspecified pancreatitis type    Rx / DC Orders ED Discharge Orders         Ordered    HYDROcodone-acetaminophen (NORCO/VICODIN) 5-325 MG tablet  Every 6 hours PRN        05/25/21 0518    ondansetron (ZOFRAN ODT) 8 MG disintegrating tablet  Every 8 hours PRN         05/25/21 0518           Zadie Rhine, MD 05/25/21 [phone]

## 2021-10-28 ENCOUNTER — Emergency Department (HOSPITAL_COMMUNITY)
Admission: EM | Admit: 2021-10-28 | Discharge: 2021-10-29 | Disposition: A | Discharge status: Home / Self Care | Payer: Self-pay | Attending: Emergency Medicine | Admitting: Emergency Medicine

## 2021-10-28 ENCOUNTER — Other Ambulatory Visit: Payer: Self-pay

## 2021-10-28 DIAGNOSIS — K852 Alcohol induced acute pancreatitis without necrosis or infection: Secondary | ICD-10-CM | POA: Insufficient documentation

## 2021-10-28 LAB — CBC WITH DIFFERENTIAL/PLATELET
Abs Immature Granulocytes: 0.04 10*3/uL (ref 0.00–0.07)
Basophils Absolute: 0 10*3/uL (ref 0.0–0.1)
Basophils Relative: 0 %
Eosinophils Absolute: 0 10*3/uL (ref 0.0–0.5)
Eosinophils Relative: 0 %
HCT: 44.8 % (ref 39.0–52.0)
Hemoglobin: 14.8 g/dL (ref 13.0–17.0)
Immature Granulocytes: 0 %
Lymphocytes Relative: 5 %
Lymphs Abs: 0.5 10*3/uL — ABNORMAL LOW (ref 0.7–4.0)
MCH: 30.3 pg (ref 26.0–34.0)
MCHC: 33 g/dL (ref 30.0–36.0)
MCV: 91.6 fL (ref 80.0–100.0)
Monocytes Absolute: 0.4 10*3/uL (ref 0.1–1.0)
Monocytes Relative: 4 %
Neutro Abs: 10.2 10*3/uL — ABNORMAL HIGH (ref 1.7–7.7)
Neutrophils Relative %: 91 %
Platelets: 312 10*3/uL (ref 150–400)
RBC: 4.89 MIL/uL (ref 4.22–5.81)
RDW: 13.5 % (ref 11.5–15.5)
WBC: 11.3 10*3/uL — ABNORMAL HIGH (ref 4.0–10.5)
nRBC: 0 % (ref 0.0–0.2)

## 2021-10-28 LAB — COMPREHENSIVE METABOLIC PANEL
ALT: 30 U/L (ref 0–44)
AST: 30 U/L (ref 15–41)
Albumin: 4 g/dL (ref 3.5–5.0)
Alkaline Phosphatase: 102 U/L (ref 38–126)
Anion gap: 10 (ref 5–15)
BUN: 12 mg/dL (ref 6–20)
CO2: 24 mmol/L (ref 22–32)
Calcium: 9.3 mg/dL (ref 8.9–10.3)
Chloride: 101 mmol/L (ref 98–111)
Creatinine, Ser: 0.77 mg/dL (ref 0.61–1.24)
GFR, Estimated: >60 mL/min (ref >60)
Glucose, Bld: 166 mg/dL — ABNORMAL HIGH (ref 70–99)
Potassium: 3.6 mmol/L (ref 3.5–5.1)
Sodium: 135 mmol/L (ref 135–145)
Total Bilirubin: 0.4 mg/dL (ref 0.3–1.2)
Total Protein: 7.4 g/dL (ref 6.5–8.1)

## 2021-10-28 LAB — LIPASE, BLOOD: Lipase: 615 U/L — ABNORMAL HIGH (ref 11–51)

## 2021-10-28 MED ORDER — OXYCODONE-ACETAMINOPHEN 5-325 MG PO TABS
1.0000 | ORAL_TABLET | Freq: Once | ORAL | Status: AC
  Administered 2021-10-28: 1 via ORAL
  Filled 2021-10-28: qty 1

## 2021-10-28 MED ORDER — ONDANSETRON 4 MG PO TBDP
4.0000 mg | ORAL_TABLET | Freq: Once | ORAL | Status: AC
  Administered 2021-10-29: 4 mg via ORAL
  Filled 2021-10-28: qty 1

## 2021-10-28 NOTE — ED Triage Notes (Signed)
C/O abdominal pain started at 0900 along w/ NV; EMS endorsed patient was diaphoretic, denies prior health hx

## 2021-10-28 NOTE — ED Provider Notes (Signed)
Emergency Medicine Provider Triage Evaluation Note  Dequavius Kuhner , a 45 y.o. male  was evaluated in triage.  Pt complains of gradual onset, constant, sharp, epigastric/right upper quadrant abdominal pain that began this morning around 9 AM.  Patient also complains of nausea and vomiting.  He is unable describe the emesis as he states that he was too focused on the pain.  He does admit to drinking 3 beers yesterday, 12 ounces in size.  He states he does not typically drink. No other complaints, No previous abd surgeries.  Review of Systems  Positive: + abd pain, nausea, vomiting Negative: - fevers, diarrhea  Physical Exam  BP (!) 129/91   Pulse 84   Temp 98.4 F (36.9 C) (Oral)   Resp 19   SpO2 98%  Gen:   Awake, no distress   Resp:  Normal effort  MSK:   Moves extremities without difficulty  Other:  + epigastric and RUQ TTP. NO LUQ TTP.   Medical Decision Making  Medically screening exam initiated at 5:29 PM.  Appropriate orders placed.  Bronsen Serano was informed that the remainder of the evaluation will be completed by another provider, this initial triage assessment does not replace that evaluation, and the importance of remaining in the ED until their evaluation is complete.     Tanda Rockers, PA-C 10/28/21 1730    Ernie Avena, MD 10/28/21 1730

## 2021-10-29 ENCOUNTER — Telehealth (HOSPITAL_COMMUNITY): Payer: Self-pay | Admitting: Emergency Medicine

## 2021-10-29 LAB — URINALYSIS, ROUTINE W REFLEX MICROSCOPIC
Bacteria, UA: NONE SEEN
Bilirubin Urine: NEGATIVE
Glucose, UA: 50 mg/dL — AB
Hgb urine dipstick: NEGATIVE
Ketones, ur: 5 mg/dL — AB
Leukocytes,Ua: NEGATIVE
Nitrite: NEGATIVE
Protein, ur: 30 mg/dL — AB
Specific Gravity, Urine: 1.03 (ref 1.005–1.030)
pH: 6 (ref 5.0–8.0)

## 2021-10-29 MED ORDER — OXYCODONE-ACETAMINOPHEN 5-325 MG PO TABS
1.0000 | ORAL_TABLET | Freq: Once | ORAL | Status: AC
  Administered 2021-10-29: 1 via ORAL
  Filled 2021-10-29: qty 1

## 2021-10-29 MED ORDER — HYDROCODONE-ACETAMINOPHEN 5-325 MG PO TABS
1.0000 | ORAL_TABLET | Freq: Four times a day (QID) | ORAL | 0 refills | Status: DC | PRN
Start: 2021-10-29 — End: 2021-10-29

## 2021-10-29 MED ORDER — HYDROCODONE-ACETAMINOPHEN 5-325 MG PO TABS: 1.0000 | ORAL_TABLET | Freq: Four times a day (QID) | ORAL | 0 refills | Status: AC | PRN

## 2021-10-29 MED ORDER — SODIUM CHLORIDE 0.9 % IV BOLUS
1000.0000 mL | Freq: Once | INTRAVENOUS | Status: AC
  Administered 2021-10-29: 1000 mL via INTRAVENOUS

## 2021-10-29 MED ORDER — ONDANSETRON 4 MG PO TBDP: 4.0000 mg | ORAL_TABLET | Freq: Three times a day (TID) | ORAL | 0 refills | Status: AC | PRN

## 2021-10-29 MED ORDER — ONDANSETRON 4 MG PO TBDP: 4.0000 mg | ORAL_TABLET | Freq: Three times a day (TID) | ORAL | 0 refills | Status: DC | PRN

## 2021-10-29 NOTE — ED Provider Notes (Addendum)
[facility] EMERGENCY DEPARTMENT Provider Note   CSN: [id number] Arrival date & time: 10/28/21  1715     History Chief Complaint  Patient presents with   Abdominal Pain    Spencer Peterson is a 45 y.o. 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  Presents to the emergency department with complaint of epigastric abdominal pain, nausea, and vomiting.  Patient reports that epigastric abdominal pain started yesterday morning approximately at 0900.  Pain onset was gradual.  Patient describes pain as sharp.  Pain has been constant.  Patient reports pain was worse with episode of vomiting.  Pain is improved after receiving oral Percocet x1.  At present patient rates pain 5/10 on the pain scale.  Patient endorses nausea and vomiting.  States that he vomited once yesterday.  Patient described emesis as stomach contents.  Denies any hematemesis or coffee-ground emesis.  Patient reports that his nausea has resolved.  Patient denies any fever, abdominal distention, constipation, diarrhea, bloody stool, melena, dysuria, hematuria, urinary urgency, swelling or tenderness to genitals, genital sores or lesions.  Patient endorses recent alcohol use.  States that on 11/10 he had three 12 ounce beers.  Patient states he has not had any alcohol other than recently.  Denies any illicit drug use.  Denies any history of abdominal surgeries.  Spanish interpreter was used to conduct this interview and examination.   Abdominal Pain Associated symptoms: chills, nausea and vomiting   Associated symptoms: no chest pain, no constipation, no diarrhea, no dysuria, no fever, no hematuria and no shortness of breath       No past medical history on file.  There are no problems to display for this patient.   Past Surgical History:  Procedure Laterality Date   SCROTAL EXPLORATION Bilateral 10/22/2013   Procedure: SCROTUM EXPLORATION, Left Orchiectomy, Right  Orchiopexy;  Surgeon: Franchot Gallo, MD;  Location: [facility];  Service: Urology;  Laterality: Bilateral;       No family history on file.  Social History   Tobacco Use   Smoking status: Never   Smokeless tobacco: Never  Substance Use Topics   Alcohol use: Yes    Comment: occ   Drug use: Never    Home Medications Prior to Admission medications   Medication Sig Start Date End Date Taking? Authorizing Provider  HYDROcodone-acetaminophen (NORCO/VICODIN) 5-325 MG tablet Take 1 tablet by mouth every 6 (six) hours as needed for severe pain. 05/25/21   Ripley Fraise, MD  ondansetron (ZOFRAN ODT) 8 MG disintegrating tablet Take 1 tablet (8 mg total) by mouth every 8 (eight) hours as needed. 05/25/21   Ripley Fraise, MD  potassium chloride SA (KLOR-CON) 20 MEQ tablet Take 1 tablet (20 mEq total) by mouth daily. 04/20/20 05/25/21  Petrucelli, Glynda Jaeger, PA-C    Allergies    Patient has no known allergies.  Review of Systems   Review of Systems  Constitutional:  Positive for chills. Negative for fever.  Eyes:  Negative for visual disturbance.  Respiratory:  Negative for shortness of breath.   Cardiovascular:  Negative for chest pain.  Gastrointestinal:  Positive for abdominal pain, nausea and vomiting. Negative for abdominal distention, anal bleeding, blood in stool, constipation, diarrhea and rectal pain.  Genitourinary:  Negative for difficulty urinating, dysuria, flank pain, frequency, genital sores, hematuria, penile discharge, penile pain, penile swelling, scrotal swelling, testicular pain and urgency.  Musculoskeletal:  Negative for back pain and neck pain.  Skin:  Negative for color  change and rash.  Neurological:  Negative for dizziness, syncope, light-headedness and headaches.  Psychiatric/Behavioral:  Negative for confusion.    Physical Exam Updated Vital Signs BP (!) 154/89 (BP Location: Left Arm)   Pulse 64   Temp 97.9 F (36.6 C) (Oral)   Resp 16   SpO2 100%    Physical Exam Vitals and nursing note reviewed.  Constitutional:      General: He is not in acute distress.    Appearance: He is not ill-appearing, toxic-appearing or diaphoretic.  HENT:     Head: Normocephalic.  Eyes:     General: No scleral icterus.       Right eye: No discharge.        Left eye: No discharge.  Cardiovascular:     Rate and Rhythm: Normal rate.  Pulmonary:     Effort: Pulmonary effort is normal. No tachypnea, bradypnea or respiratory distress.     Breath sounds: Normal breath sounds. No stridor.  Abdominal:     General: Abdomen is flat. Bowel sounds are normal. There is no distension. There are no signs of injury.     Palpations: Abdomen is soft. There is no mass or pulsatile mass.     Tenderness: There is abdominal tenderness in the epigastric area. There is no right CVA tenderness, left CVA tenderness, guarding or rebound. Negative signs include Murphy's sign.     Hernia: There is no hernia in the umbilical area or ventral area.  Skin:    General: Skin is warm and dry.  Neurological:     General: No focal deficit present.     Mental Status: He is alert.  Psychiatric:        Behavior: Behavior is cooperative.    ED Results / Procedures / Treatments   Labs (all labs ordered are listed, but only abnormal results are displayed) Labs Reviewed  COMPREHENSIVE METABOLIC PANEL - Abnormal; Notable for the following components:      Result Value   Glucose, Bld 166 (*)    All other components within normal limits  LIPASE, BLOOD - Abnormal; Notable for the following components:   Lipase 615 (*)    All other components within normal limits  CBC WITH DIFFERENTIAL/PLATELET - Abnormal; Notable for the following components:   WBC 11.3 (*)    Neutro Abs 10.2 (*)    Lymphs Abs 0.5 (*)    All other components within normal limits  URINALYSIS, ROUTINE W REFLEX MICROSCOPIC - Abnormal; Notable for the following components:   APPearance HAZY (*)    Glucose, UA 50 (*)     Ketones, ur 5 (*)    Protein, ur 30 (*)    All other components within normal limits    EKG None  Radiology No results found.  Procedures Procedures   Medications Ordered in ED Medications  ondansetron (ZOFRAN-ODT) disintegrating tablet 4 mg (4 mg Oral Not Given 10/28/21 1741)  oxyCODONE-acetaminophen (PERCOCET/ROXICET) 5-325 MG per tablet 1 tablet (1 tablet Oral Given 10/28/21 1745)    ED Course  I have reviewed the triage vital signs and the nursing notes.  Pertinent labs & imaging results that were available during my care of the patient were reviewed by me and considered in my medical decision making (see chart for details).    MDM Rules/Calculators/A&P                           Alert 45 year old male in no acute distress,  nontoxic-appearing.  Presents to the ED with chief complaint of epigastric abdominal pain, nausea, and vomiting.  Patient has history of alcohol induced acute pancreatitis.  Patient had three 12 ounce beers the day before his symptoms started.  Endorses vomiting once.  No hematemesis or coffee-ground emesis.  Vomiting has resolved.  Pain markedly improved after Percocet x1.  Abdomen soft, nondistended, mild tenderness to epigastric area, no guarding or rebound tenderness.  Negative Murphy sign.  Lipase elevated at 615. CBC shows slight leukocytosis at 11.3 CMP is unremarkable Urinalysis shows no signs of infection, elevated ketones likely secondary to decreased p.o. intake.  Suspect the patient's symptoms are secondary to acute pancreatitis.  Lipase with previous episodes has seen greater elevations.  Suspect that leukocytosis is secondary to acute phase reactant from acute pancreatitis.  Low suspicion for complication at this time as patient symptoms are improving, no guarding or rebound tenderness.  We will give patient oral pain medication, fluid bolus and reassess.  Patient reports improvement in pain after receiving pain medication.  Able to  tolerate p.o. intake without difficulty.  Abdomen soft, nondistended, improved tenderness to epigastric area on serial reexamination.  Will prescribe patient with short course of pain medication and Zofran.  Patient to follow-up with PCP and gastroenterologist.  Importance of cessation from alcohol is stressed to the patient.  Discussed results, findings, treatment and follow up. Patient advised of return precautions. Patient verbalized understanding and agreed with plan.  After writing prescriptions and discharging patient was contacted by RN who states that patient is requesting change of pharmacy.  I contacted original pharmacy to have both prescriptions canceled.  New prescriptions were sent to correct pharmacy.  Patient care was discussed with attending physician Dr. Wallace Cullens.  Translator was used for all interactions with this patient.  Final Clinical Impression(s) / ED Diagnoses Final diagnoses:  Alcohol-induced acute pancreatitis, unspecified complication status    Rx / DC Orders ED Discharge Orders          Ordered    HYDROcodone-acetaminophen (NORCO/VICODIN) 5-325 MG tablet  Every 6 hours PRN        10/29/21 0855    ondansetron (ZOFRAN ODT) 4 MG disintegrating tablet  Every 8 hours PRN        10/29/21 0855             Haskel Schroeder, PA-C 10/29/21 0901    Haskel Schroeder, PA-C 10/29/21 0919    Sloan Leiter, DO 10/29/21 1610

## 2021-10-29 NOTE — Discharge Instructions (Addendum)
You came to the emergency department today to be evaluated for your stomach pain, nausea, and vomiting.  You were found to have acute pancreatitis.  This is likely due to your alcohol use.  Please do not use any alcohol in the future.  I have given you a short course of pain medication as well as nausea medication.  Please take this as prescribed.  Please follow-up with your primary care provider as well as the gastroenterologist I have given you information for.  Today you received medications that may make you sleepy or impair your ability to make decisions.  For the next 24 hours please do not drive, operate heavy machinery, care for a small child with out another adult present, or perform any activities that may cause harm to you or someone else if you were to fall asleep or be impaired.   You are being prescribed a medication which may make you sleepy. Please follow up of listed precautions for at least 24 hours after taking one dose.  Get help right away if: You cannot eat or keep fluids down. Your pain becomes severe. Your skin or the white part of your eyes turns yellow (jaundice). You have sudden swelling in your abdomen. You vomit. You feel dizzy or you faint. Your blood sugar is high (over 300 mg/dL).

## 2021-11-08 IMAGING — CT CT ABD-PELV W/ CM
2 of 5 series · 14 of 46 positions shown, 16 images · IV contrast (omnipaque)
Comparison: CT chest 06/24/2019

CLINICAL DATA: Pancreatitis suspected, right and left upper
quadrant abdominal pain for 1 day, [DATE] pain.

EXAM:
CT ABDOMEN AND PELVIS WITH CONTRAST
TECHNIQUE: Multidetector CT imaging of the abdomen and pelvis was performed
using the standard protocol following bolus administration of
intravenous contrast.
CONTRAST:  100mL OMNIPAQUE IOHEXOL 300 MG/ML  SOLN

[Series 3: abdomen 5.0 · axial · 0.79mm/px · z∈[+785,+1230]mm · 11 of 101 slices shown, 13 images]
[im 6/101  soft-tissue]
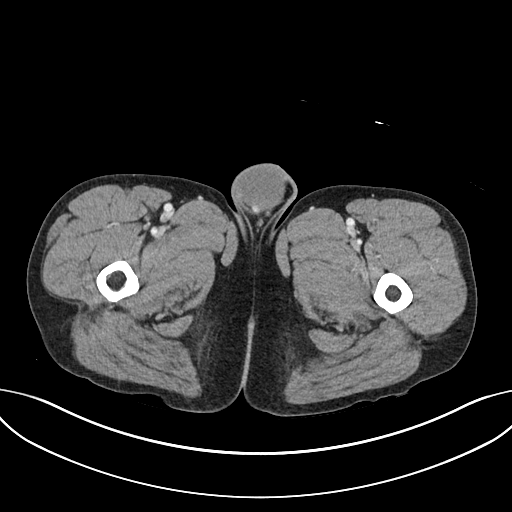
[im 6/101  bone]
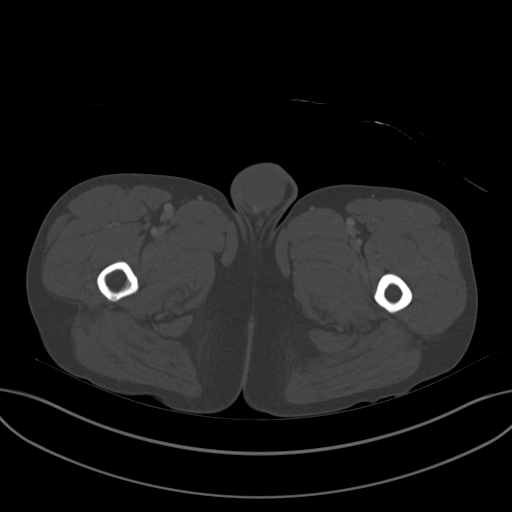
[im 18/101  soft-tissue]
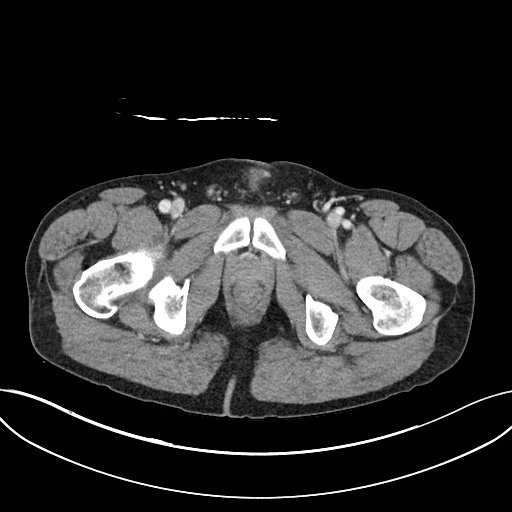
[im 24/101  soft-tissue]
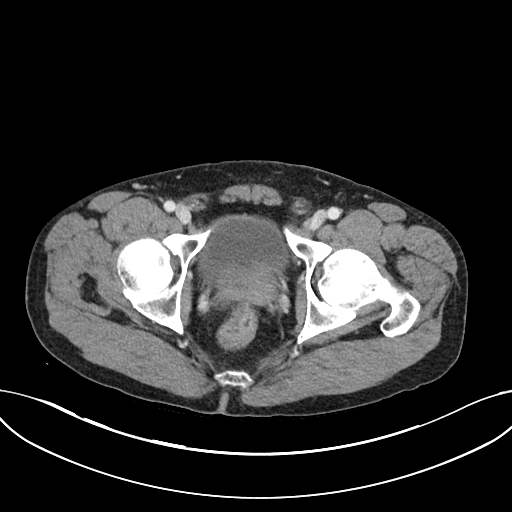
[im 36/101  soft-tissue]
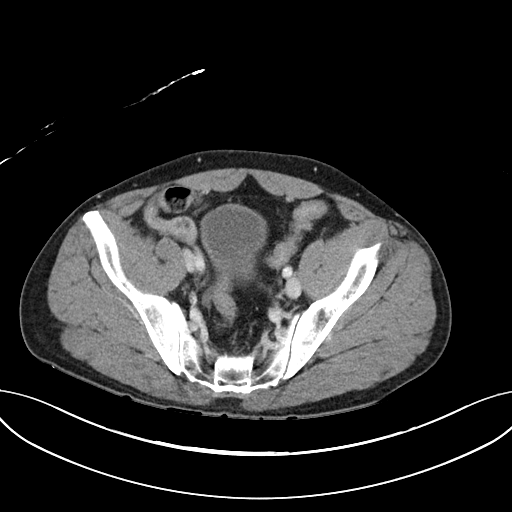
[im 42/101  soft-tissue]
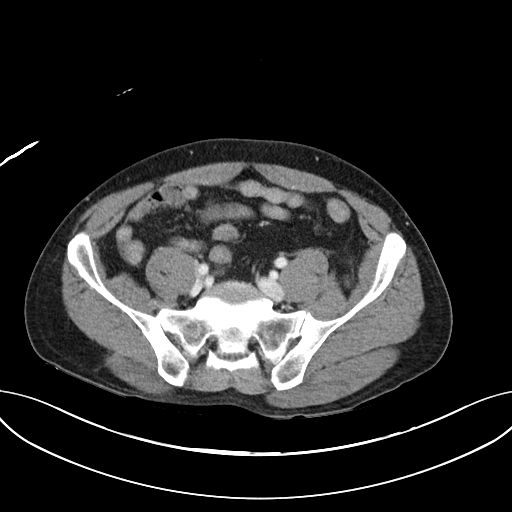
[im 53/101  soft-tissue]
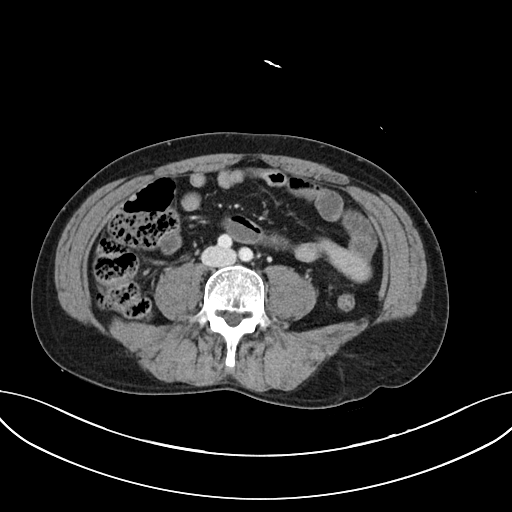
[im 59/101  soft-tissue]
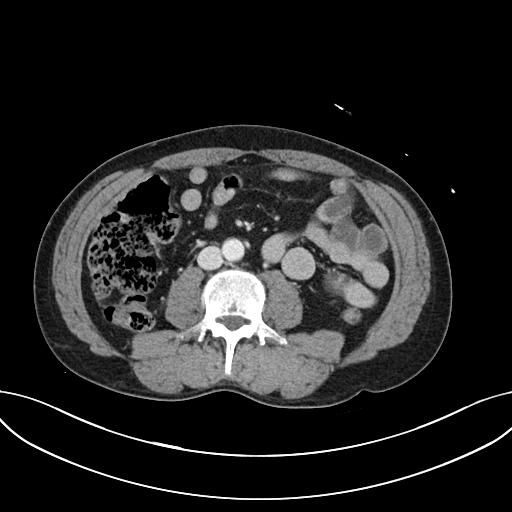
[im 65/101  soft-tissue]
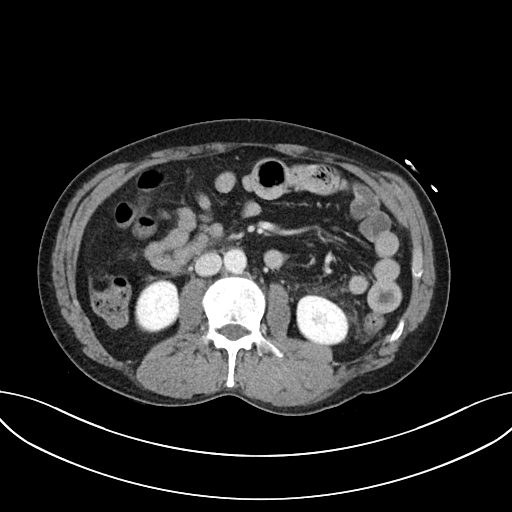
[im 77/101  soft-tissue]
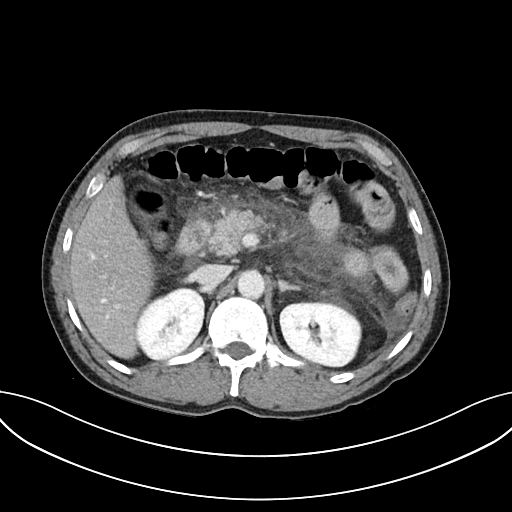
[im 77/101  bone]
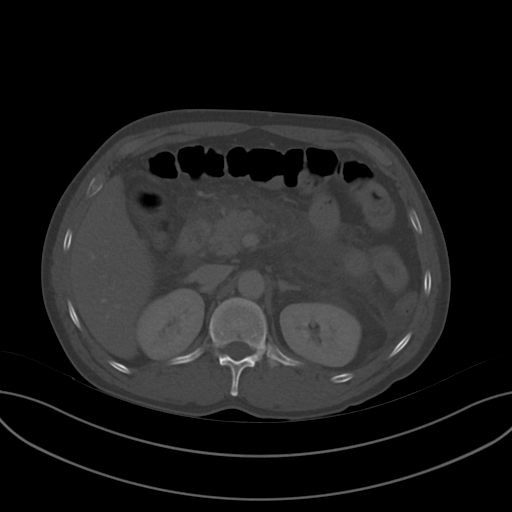
[im 83/101  soft-tissue]
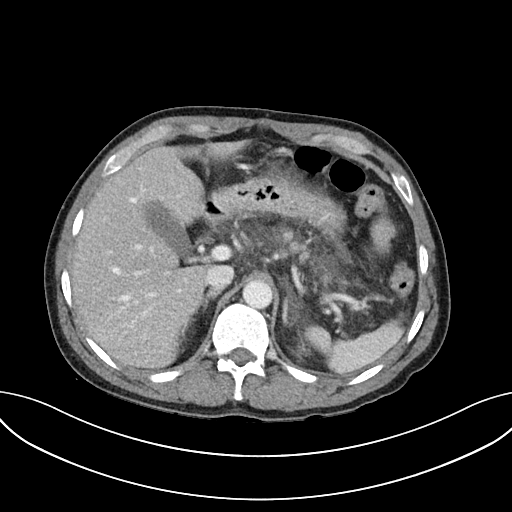
[im 95/101  soft-tissue]
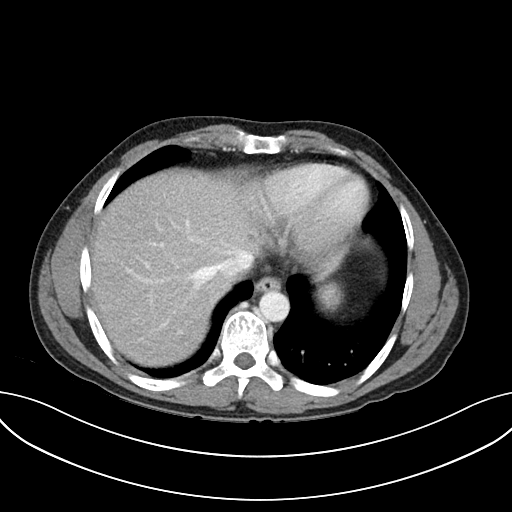

[Series 6: abdomen 3.0 mpr cor · coronal · 0.69mm/px · 3 of 83 slices shown]
[im 28/83  soft-tissue]
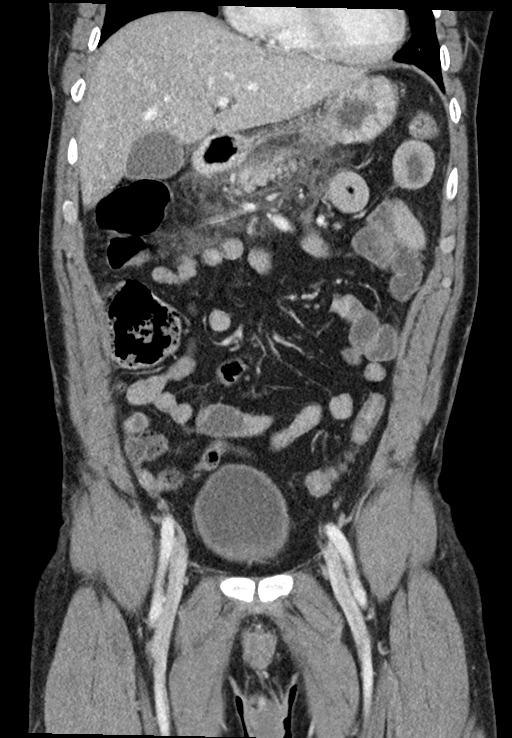
[im 37/83  soft-tissue]
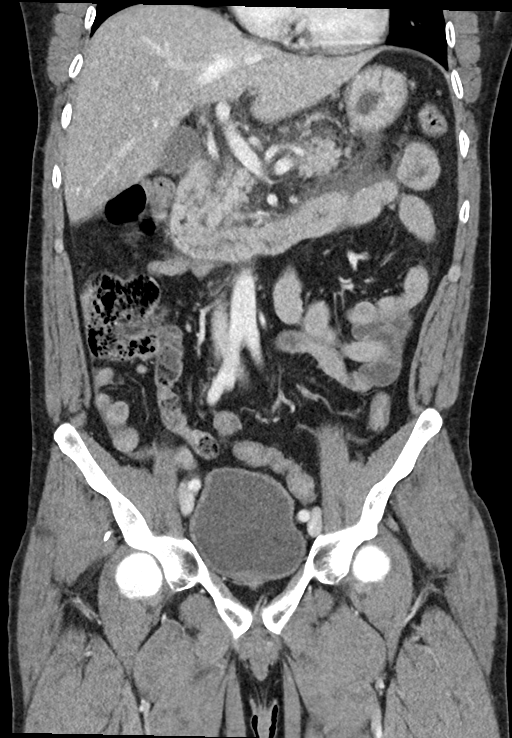
[im 46/83  soft-tissue]
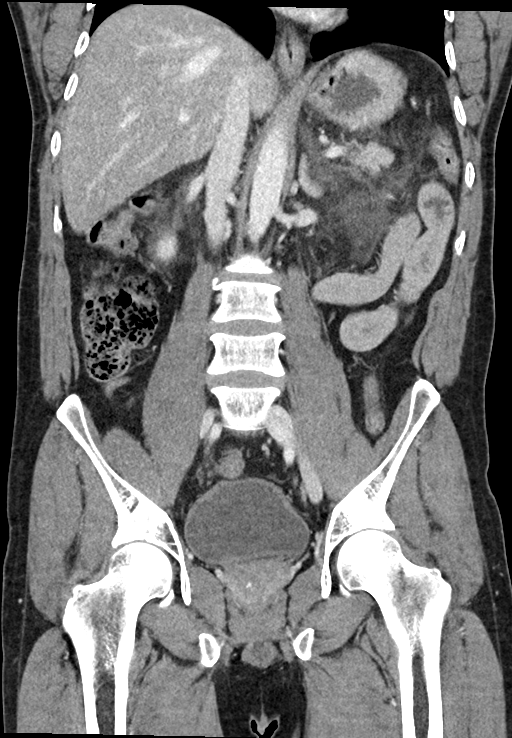

[14 of 46 positions shown; findings below may reference images not displayed]

FINDINGS: Lower chest: Dependent ground-glass changes in the lung bases favor
atelectasis. No effusion. Normal heart size. No pericardial
effusion.

Hepatobiliary: Few subcentimeter hypoattenuating foci throughout the
liver too small to fully characterize on CT imaging but
statistically likely benign. No worrisome focal liver lesions.
Smooth liver surface contour. Normal hepatic attenuation. Small
calcified gallstone seen layering dependently in the gallbladder. No
visible intraductal gallstones. Slight mural thickening of the
distal common bile duct, possibly reactive.

Pancreas: Diffuse edematous appearance of the pancreas without
regions of hypoattenuation to suggest pancreatic necrosis. There is
extensive peripancreatic stranding and reactive edematous fluid
along both the intraperitoneal and retroperitoneal pancreatic
segments without loculated or organized collection.

Spleen: Normal in size without focal abnormality.

Adrenals/Urinary Tract: Normal adrenal glands. Kidneys are normally
located with symmetric enhancementand excretion. No suspicious renal
lesion, urolithiasis or hydronephrosis. No stone some mild bladder
wall thickening without significant perivesicular stranding.

Stomach/Bowel: Distal esophagus and stomach is normal. There is
stranding along the gastric antrum and duodenal sweep with mild
mural thickening of the duodenum as it crosses midline, favored to
be reactive to the pancreatic findings above. More distal small
bowel has a normal appearance. A normal appendix is visualized.
Proximal colon is unremarkable. Slight thickening and pericolonic
stranding adjacent the descending colon is likely secondary to the
adjacent pancreatic inflammation as well. More distal colon is
unremarkable.

Vascular/Lymphatic: The aorta is normal caliber. No pathologically
enlarged abdominopelvic nodes.

Reproductive: The prostate and seminal vesicles are unremarkable.

Other: Peripancreatic inflammation and likely reactive free fluid
within the retroperitoneum and along the left pericolic gutter, as
detailed above. No abdominopelvic free air. No bowel containing
hernias.

Musculoskeletal: No acute osseous abnormality or suspicious osseous
lesion.
IMPRESSION: 1. Diffuse edematous appearance of the pancreas without regions of
hypoattenuation to suggest pancreatic necrosis. Findings are
compatible with acute interstitial edematous pancreatitis. No
features of pancreatic necrosis or organized acute peripancreatic
collections.
2. Cholelithiasis without CT evidence of acute cholecystitis. Mild
mural thickening of the distal common bile duct, possibly reactive.
3. Mild thickening and stranding about the gastric antrum, duodenum
and portion of the descending colon is favored to be reactive and
secondary to the pancreatic inflammation above.
4. Mild bladder wall thickening but without significant
perivesicular stranding. Recommend clinical assessment for urinary
symptoms.

## 2022-12-13 IMAGING — CT CT ABD-PELV W/ CM
2 of 5 series · 15 of 46 positions shown, 17 images · IV contrast (APPLIED)
Comparison: 04/19/2020, CT chest 06/24/2019

CLINICAL DATA: Acute pancreatitis, central abdominal pain, vomiting

EXAM:
CT ABDOMEN AND PELVIS WITH CONTRAST
TECHNIQUE: Multidetector CT imaging of the abdomen and pelvis was performed
using the standard protocol following bolus administration of
intravenous contrast.
CONTRAST:  100mL OMNIPAQUE IOHEXOL 300 MG/ML  SOLN

[Series 3: abdomen 5.0 · axial · 0.71mm/px · z∈[-334,+76]mm · 12 of 96 slices shown, 14 images]
[im 7/96  soft-tissue]
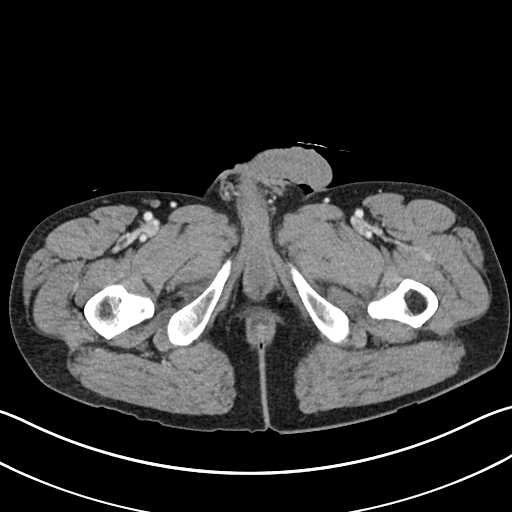
[im 7/96  bone]
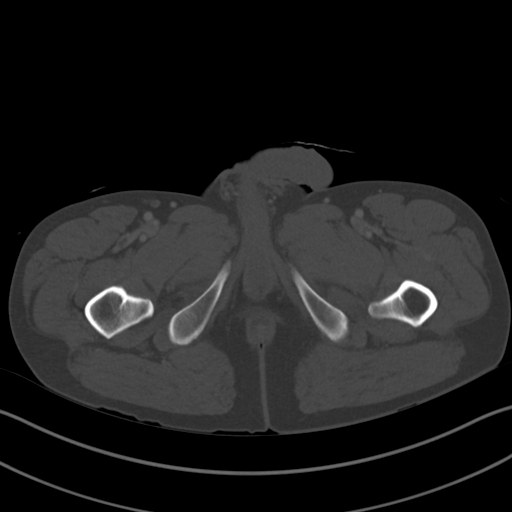
[im 13/96  soft-tissue]
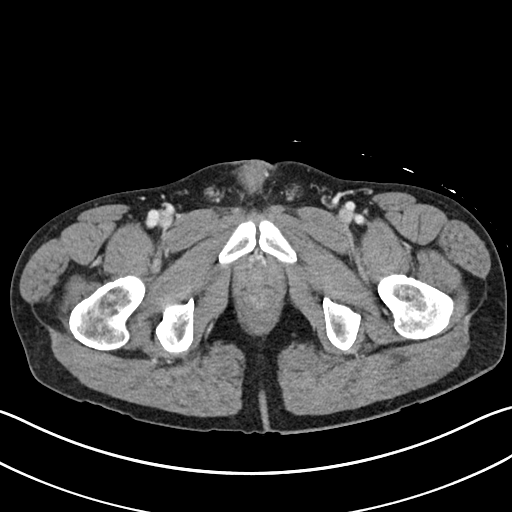
[im 20/96  soft-tissue]
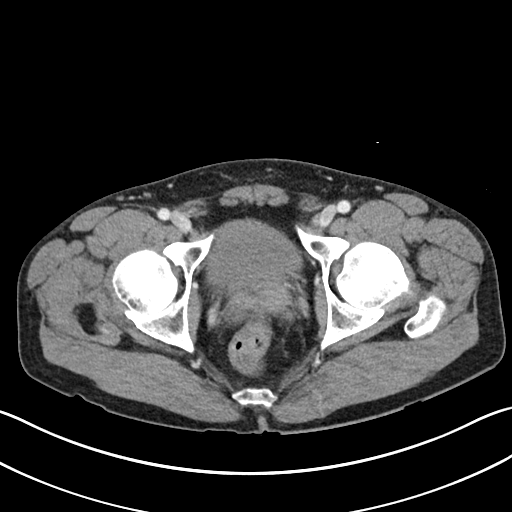
[im 32/96  soft-tissue]
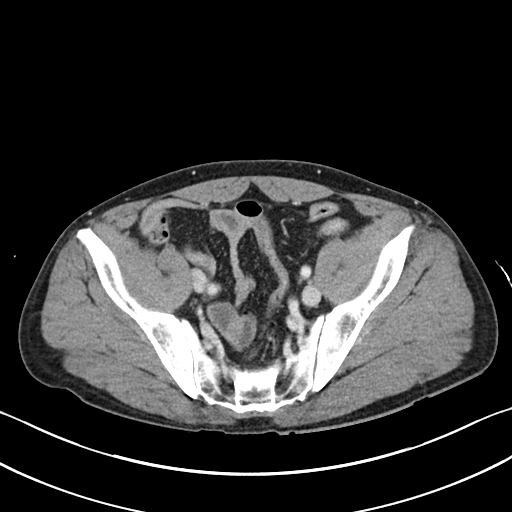
[im 39/96  soft-tissue]
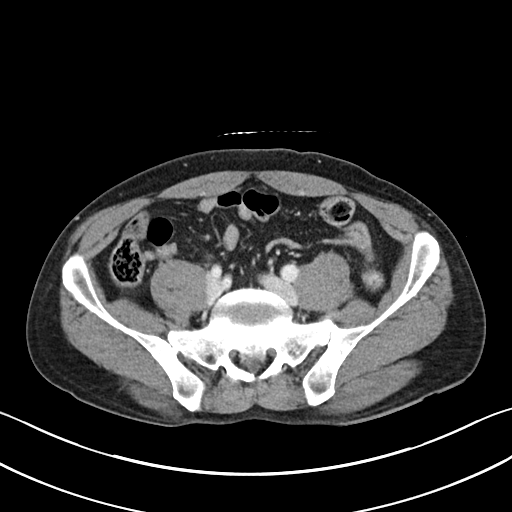
[im 45/96  soft-tissue]
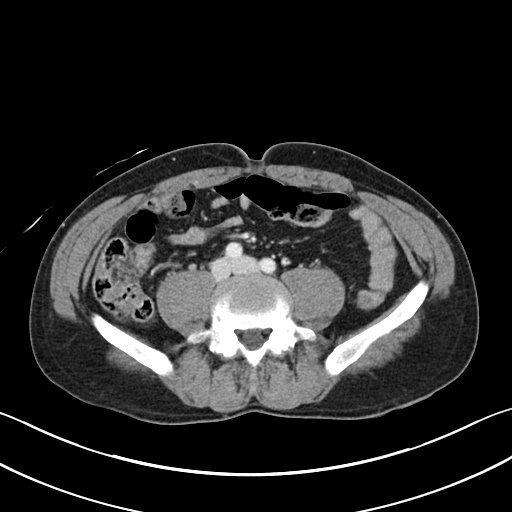
[im 51/96  soft-tissue]
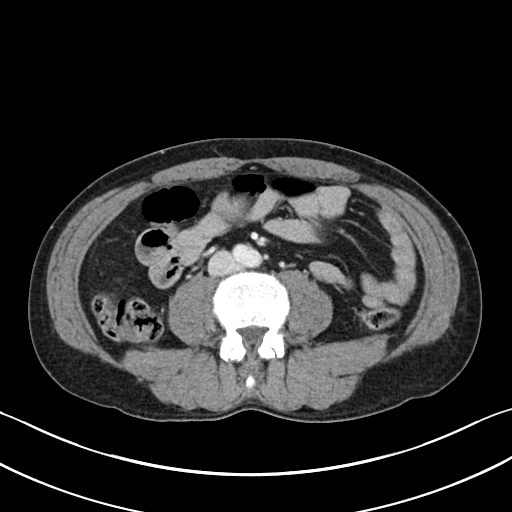
[im 58/96  soft-tissue]
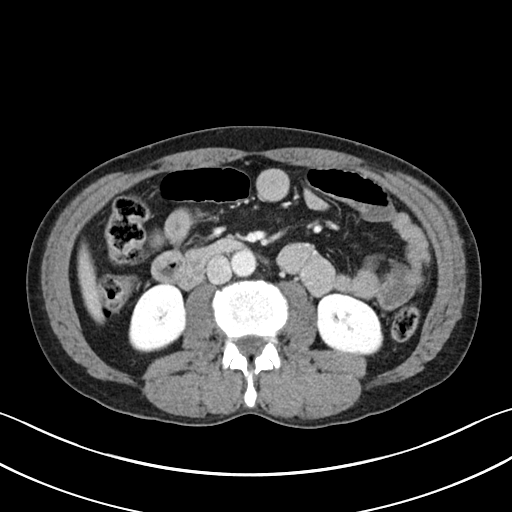
[im 64/96  soft-tissue]
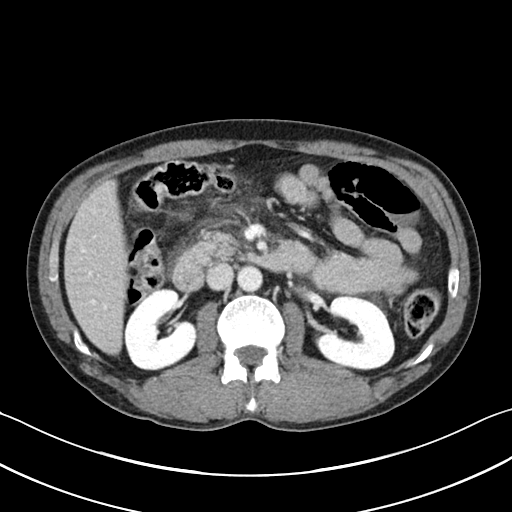
[im 64/96  bone]
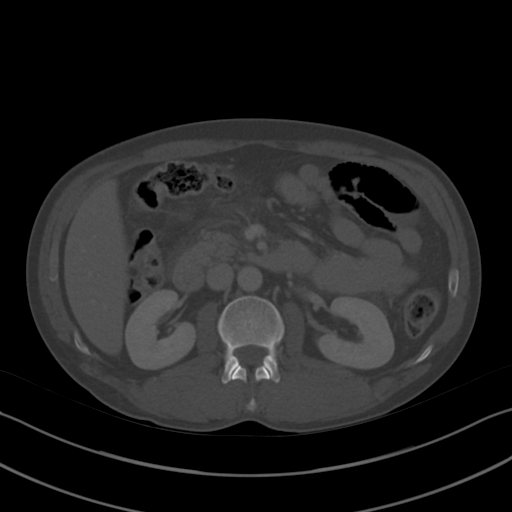
[im 77/96  soft-tissue]
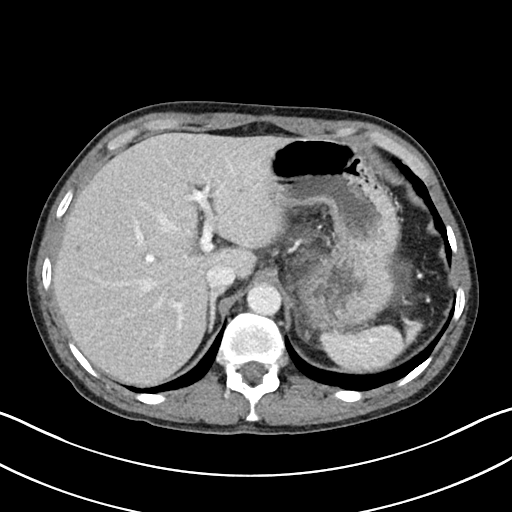
[im 83/96  soft-tissue]
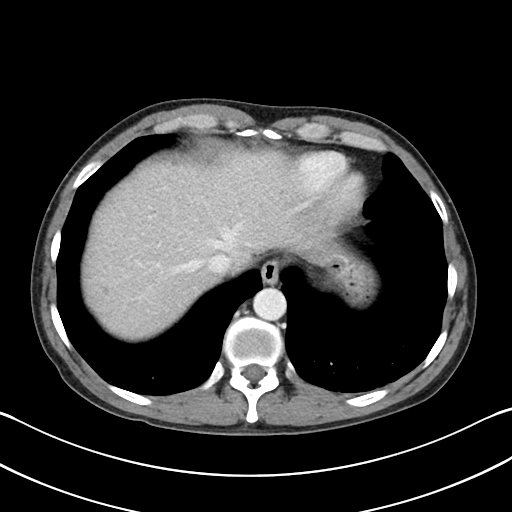
[im 89/96  soft-tissue]
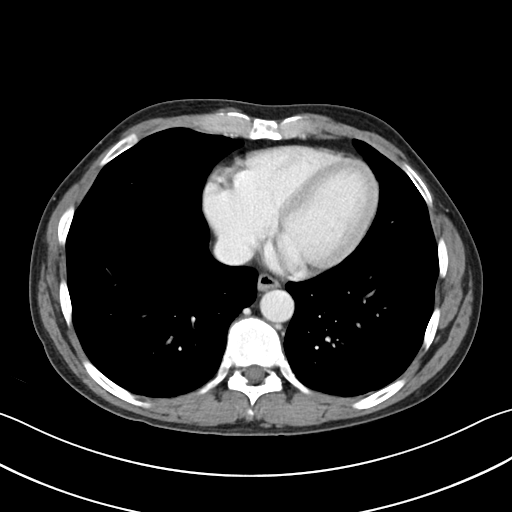

[Series 5: abdomen 3.0 mpr cor · coronal · 0.69mm/px · 3 of 91 slices shown]
[im 31/91  soft-tissue]
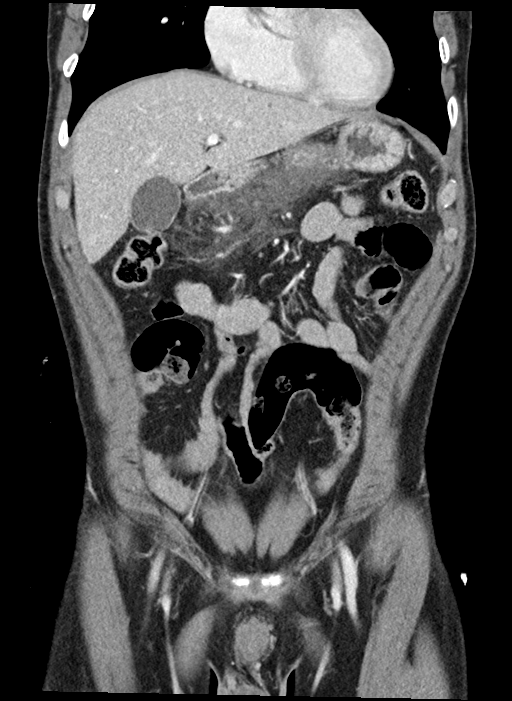
[im 41/91  soft-tissue]
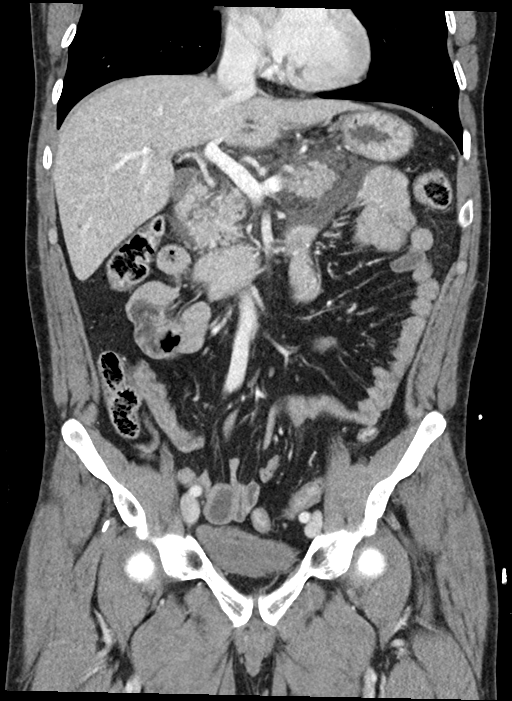
[im 51/91  soft-tissue]
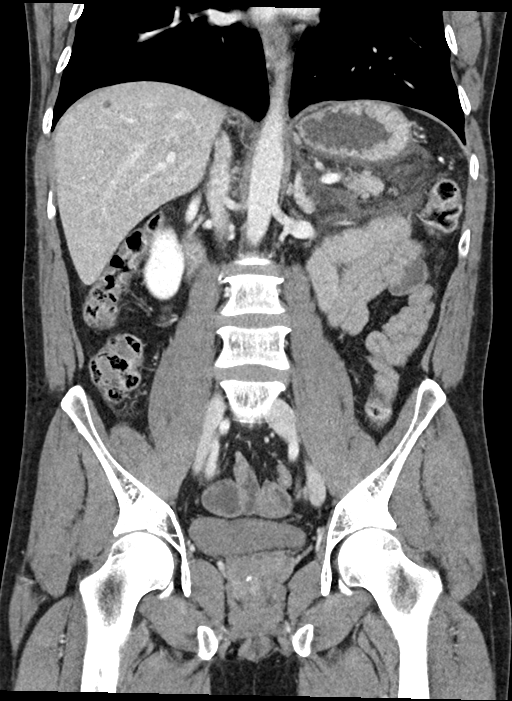

[15 of 46 positions shown; findings below may reference images not displayed]

FINDINGS: Lower chest: Noncalcified right middle lobe and left lower lobe
pulmonary nodules appear stable since remote prior examination. The
lungs are otherwise clear. The visualized heart and pericardium are
unremarkable.

Hepatobiliary: Tiny scattered cysts are seen throughout the liver.
The liver is otherwise unremarkable. No intra or extrahepatic
biliary ductal dilation. Gallbladder unremarkable.

Pancreas: There is moderate peripancreatic inflammatory change and
edema in keeping with changes of acute pancreatitis encompassing the
entire pancreas. There is normal enhancement of the pancreatic
parenchyma. The pancreatic duct is not dilated. No parenchymal
calcifications are seen. No loculated peripancreatic fluid
collections are present. Altogether, the findings are most in
keeping with acute interstitial/edematous pancreatitis.

Spleen: Unremarkable

Adrenals/Urinary Tract: Adrenal glands are unremarkable. Kidneys are
normal, without renal calculi, focal lesion, or hydronephrosis.
Bladder is unremarkable.

Stomach/Bowel: Stomach is within normal limits. Appendix appears
normal. No evidence of bowel wall thickening, distention, or
inflammatory changes. No free intraperitoneal gas or fluid.

Vascular/Lymphatic: The abdominal vasculature is unremarkable.
Specifically, the superior mesenteric vein, splenic vein, and portal
vein are patent. The celiac axis and proximal superior mesenteric
artery appear unremarkable on this non arteriographic study. No
pathologic adenopathy within the abdomen and pelvis.

Reproductive: Prostate is unremarkable.

Other: Tiny fat containing umbilical hernia.  Rectum unremarkable.

Musculoskeletal: No acute bone abnormality.
IMPRESSION: Acute, uncomplicated, interstitial/edematous pancreatitis.
